# Patient Record
Sex: Male | Born: 1985 | Race: White | Hispanic: No | Marital: Single | Smoking: Never smoker
Health system: Southern US, Community
[De-identification: ages and names within clinical notes are randomized; demographics above are authoritative.]

## PROBLEM LIST (undated history)

## (undated) DIAGNOSIS — J302 Other seasonal allergic rhinitis: Secondary | ICD-10-CM

## (undated) DIAGNOSIS — J45990 Exercise induced bronchospasm: Secondary | ICD-10-CM

## (undated) DIAGNOSIS — G43909 Migraine, unspecified, not intractable, without status migrainosus: Secondary | ICD-10-CM

## (undated) DIAGNOSIS — I10 Essential (primary) hypertension: Secondary | ICD-10-CM

## (undated) HISTORY — PX: APPENDECTOMY: SHX54

## (undated) HISTORY — PX: TONSILLECTOMY: SUR1361

---

## 2018-07-17 ENCOUNTER — Encounter (HOSPITAL_COMMUNITY): Payer: Self-pay | Admitting: Emergency Medicine

## 2018-07-17 ENCOUNTER — Other Ambulatory Visit: Payer: Self-pay

## 2018-07-17 ENCOUNTER — Inpatient Hospital Stay (HOSPITAL_COMMUNITY)
Admission: EM | Admit: 2018-07-17 | Discharge: 2018-07-19 | DRG: 342 | Disposition: A | Discharge status: Home / Self Care | Payer: Self-pay | Attending: Surgery | Admitting: Surgery

## 2018-07-17 ENCOUNTER — Ambulatory Visit (HOSPITAL_COMMUNITY)
Admission: EM | Admit: 2018-07-17 | Discharge: 2018-07-17 | Disposition: A | Discharge status: Home / Self Care | Payer: Self-pay | Attending: Family Medicine | Admitting: Family Medicine

## 2018-07-17 DIAGNOSIS — R1011 Right upper quadrant pain: Secondary | ICD-10-CM

## 2018-07-17 DIAGNOSIS — Z6841 Body Mass Index (BMI) 40.0 and over, adult: Secondary | ICD-10-CM

## 2018-07-17 DIAGNOSIS — Z8249 Family history of ischemic heart disease and other diseases of the circulatory system: Secondary | ICD-10-CM

## 2018-07-17 DIAGNOSIS — K3580 Unspecified acute appendicitis: Principal | ICD-10-CM

## 2018-07-17 DIAGNOSIS — Z79899 Other long term (current) drug therapy: Secondary | ICD-10-CM

## 2018-07-17 DIAGNOSIS — E669 Obesity, unspecified: Secondary | ICD-10-CM | POA: Diagnosis present

## 2018-07-17 LAB — CBC
HEMATOCRIT: 44.7 % (ref 39.0–52.0)
Hemoglobin: 15.4 g/dL (ref 13.0–17.0)
MCH: 30.6 pg (ref 26.0–34.0)
MCHC: 34.5 g/dL (ref 30.0–36.0)
MCV: 88.9 fL (ref 80.0–100.0)
Platelets: 307 10*3/uL (ref 150–400)
RBC: 5.03 MIL/uL (ref 4.22–5.81)
RDW: 12.1 % (ref 11.5–15.5)
WBC: 15.5 10*3/uL — ABNORMAL HIGH (ref 4.0–10.5)
nRBC: 0 % (ref 0.0–0.2)

## 2018-07-17 LAB — COMPREHENSIVE METABOLIC PANEL
ALT: 40 U/L (ref 0–44)
AST: 20 U/L (ref 15–41)
Albumin: 4.4 g/dL (ref 3.5–5.0)
Alkaline Phosphatase: 57 U/L (ref 38–126)
Anion gap: 10 (ref 5–15)
BUN: 13 mg/dL (ref 6–20)
CHLORIDE: 104 mmol/L (ref 98–111)
CO2: 23 mmol/L (ref 22–32)
CREATININE: 0.99 mg/dL (ref 0.61–1.24)
Calcium: 9.3 mg/dL (ref 8.9–10.3)
GFR calc Af Amer: >60 mL/min (ref >60)
GLUCOSE: 95 mg/dL (ref 70–99)
Potassium: 3.9 mmol/L (ref 3.5–5.1)
SODIUM: 137 mmol/L (ref 135–145)
Total Bilirubin: 1.5 mg/dL — ABNORMAL HIGH (ref 0.3–1.2)
Total Protein: 7.1 g/dL (ref 6.5–8.1)

## 2018-07-17 LAB — URINALYSIS, ROUTINE W REFLEX MICROSCOPIC
BILIRUBIN URINE: NEGATIVE
GLUCOSE, UA: NEGATIVE mg/dL
Hgb urine dipstick: NEGATIVE
KETONES UR: NEGATIVE mg/dL
Leukocytes, UA: NEGATIVE
Nitrite: NEGATIVE
PH: 6 (ref 5.0–8.0)
Protein, ur: NEGATIVE mg/dL
Specific Gravity, Urine: 1.027 (ref 1.005–1.030)

## 2018-07-17 LAB — LIPASE, BLOOD: LIPASE: 35 U/L (ref 11–51)

## 2018-07-17 MED ORDER — ONDANSETRON HCL 4 MG/2ML IJ SOLN
INTRAMUSCULAR | Status: AC
  Filled 2018-07-17: qty 2

## 2018-07-17 MED ORDER — ONDANSETRON HCL 4 MG PO TABS: 4.0000 mg | ORAL_TABLET | Freq: Three times a day (TID) | ORAL | 0 refills | Status: DC | PRN

## 2018-07-17 MED ORDER — ONDANSETRON HCL 4 MG/2ML IJ SOLN
4.0000 mg | Freq: Once | INTRAMUSCULAR | Status: AC
  Administered 2018-07-17: 4 mg via INTRAMUSCULAR

## 2018-07-17 NOTE — ED Triage Notes (Signed)
Abdominal pain, hot flashes, cold chills and nausea and bloated.  Symptoms started this morning

## 2018-07-17 NOTE — ED Triage Notes (Addendum)
Patient reports RUQ pain with emesis onset this morning , denies fever or diarrhea , seen at [facility] urgent care this evening with the same complaints-discharged home .

## 2018-07-17 NOTE — ED Provider Notes (Signed)
[facility]    CSN: [id number] Arrival date & time: 07/17/18  1929     History   Chief Complaint Chief Complaint  Patient presents with  . Abdominal Pain    HPI Jacob Carr is a 32 y.o. male.   Patient awoke this morning with right upper quadrant pain nausea and bloated feeling.  Did not eat all day but ate a grilled cheese prior to arrival.  No vomiting but still feels bloated and nauseated.  First occurrence of the symptoms. Also noted his blood pressure is elevated.  Historically blood pressures have been normal I suspect blood pressures are related to how he feels otherwise  HPI  History reviewed. No pertinent past medical history.  There are no active problems to display for this patient.   History reviewed. No pertinent surgical history.     Home Medications    Prior to Admission medications   Medication Sig Start Date End Date Taking? Authorizing Provider  bismuth subsalicylate (PEPTO BISMOL) 262 MG/15ML suspension Take 30 mLs by mouth every 6 (six) hours as needed.   Yes [provider]    Family History Family History  Problem Relation Age of Onset  . Hypertension Mother   . Bradycardia Father     Social History Social History   Tobacco Use  . Smoking status: Never Smoker  Substance Use Topics  . Alcohol use: Not Currently  . Drug use: Never     Allergies   Patient has no known allergies.   Review of Systems Review of Systems  Constitutional: Negative.   HENT: Negative.   Respiratory: Negative.   Cardiovascular: Negative.   Gastrointestinal: Positive for abdominal distention, abdominal pain and nausea.  Neurological: Negative.   Psychiatric/Behavioral: Negative.      Physical Exam Triage Vital Signs ED Triage Vitals  Enc Vitals Group     BP 07/17/18 2011 (!) 175/112     Pulse Rate 07/17/18 2011 (!) 102     Resp 07/17/18 2011 20     Temp 07/17/18 2011 98.9 F (37.2 C)     Temp Source 07/17/18 2011 Oral   SpO2 07/17/18 2011 96 %     Weight --      Height --      Head Circumference --      Peak Flow --      Pain Score 07/17/18 2009 7     Pain Loc --      Pain Edu? --      Excl. in GC? --    No data found.  Updated Vital Signs BP (!) 175/112 (BP Location: Left Arm)   Pulse (!) 102   Temp 98.9 F (37.2 C) (Oral)   Resp 20   SpO2 96%   Visual Acuity Right Eye Distance:   Left Eye Distance:   Bilateral Distance:    Right Eye Near:   Left Eye Near:    Bilateral Near:     Physical Exam  Constitutional: He appears well-developed and well-nourished. He appears distressed.  HENT:  Mouth/Throat: Oropharynx is clear and moist.  Cardiovascular: Normal rate and regular rhythm.  Abdominal: Bowel sounds are normal. There is generalized tenderness and tenderness in the right upper quadrant. There is no rebound, no guarding and negative Murphy's sign.     UC Treatments / Results  Labs (all labs ordered are listed, but only abnormal results are displayed) Labs Reviewed - No data to display  EKG None  Radiology No results found.  Procedures Procedures (including critical care time)  Medications Ordered in UC Medications - No data to display  Initial Impression / Assessment and Plan / UC Course  I have reviewed the triage vital signs and the nursing notes.  Pertinent labs & imaging results that were available during my care of the patient were reviewed by me and considered in my medical decision making (see chart for details).     Abdominal pain.  Symptoms would really fit with gallbladder disease.  I would like to order ultrasound which has been done.  Also Zofran for nausea. Needs to follow-up with blood pressure. Final Clinical Impressions(s) / UC Diagnoses   Final diagnoses:  None   Discharge Instructions   None    ED Prescriptions    None     Controlled Substance Prescriptions [place] Controlled Substance Registry consulted? No   Frederica Kuster,  MD 07/17/18 2043

## 2018-07-18 ENCOUNTER — Inpatient Hospital Stay (HOSPITAL_COMMUNITY): Payer: Self-pay | Admitting: Certified Registered"

## 2018-07-18 ENCOUNTER — Encounter (HOSPITAL_COMMUNITY): Payer: Self-pay

## 2018-07-18 ENCOUNTER — Emergency Department (HOSPITAL_COMMUNITY): Payer: Self-pay

## 2018-07-18 ENCOUNTER — Other Ambulatory Visit: Payer: Self-pay

## 2018-07-18 ENCOUNTER — Encounter (HOSPITAL_COMMUNITY): Admission: EM | Disposition: A | Discharge status: Home / Self Care | Payer: Self-pay | Source: Home / Self Care

## 2018-07-18 DIAGNOSIS — K3580 Unspecified acute appendicitis: Secondary | ICD-10-CM | POA: Diagnosis present

## 2018-07-18 HISTORY — PX: LAPAROSCOPIC APPENDECTOMY: SHX408

## 2018-07-18 SURGERY — APPENDECTOMY, LAPAROSCOPIC
Anesthesia: General | Site: Abdomen

## 2018-07-18 MED ORDER — MIDAZOLAM HCL 2 MG/2ML IJ SOLN
INTRAMUSCULAR | Status: AC
  Filled 2018-07-18: qty 2

## 2018-07-18 MED ORDER — PROMETHAZINE HCL 25 MG/ML IJ SOLN: 6.2500 mg | INTRAMUSCULAR | Status: DC | PRN

## 2018-07-18 MED ORDER — ENOXAPARIN SODIUM 40 MG/0.4ML ~~LOC~~ SOLN
40.0000 mg | SUBCUTANEOUS | Status: DC
  Administered 2018-07-19: 40 mg via SUBCUTANEOUS
  Filled 2018-07-18: qty 0.4

## 2018-07-18 MED ORDER — LACTATED RINGERS IV SOLN
INTRAVENOUS | Status: DC
  Administered 2018-07-18: 09:00:00 via INTRAVENOUS

## 2018-07-18 MED ORDER — PROPOFOL 10 MG/ML IV BOLUS
INTRAVENOUS | Status: AC
  Filled 2018-07-18: qty 40

## 2018-07-18 MED ORDER — FENTANYL CITRATE (PF) 250 MCG/5ML IJ SOLN
INTRAMUSCULAR | Status: DC | PRN
  Administered 2018-07-18: 50 ug via INTRAVENOUS
  Administered 2018-07-18: 100 ug via INTRAVENOUS
  Administered 2018-07-18: 50 ug via INTRAVENOUS
  Administered 2018-07-18: 100 ug via INTRAVENOUS
  Administered 2018-07-18: 50 ug via INTRAVENOUS

## 2018-07-18 MED ORDER — ONDANSETRON HCL 4 MG/2ML IJ SOLN: 4.0000 mg | Freq: Three times a day (TID) | INTRAMUSCULAR | Status: AC | PRN

## 2018-07-18 MED ORDER — SODIUM CHLORIDE 0.9 % IV SOLN
2.0000 g | Freq: Once | INTRAVENOUS | Status: AC
  Administered 2018-07-18: 2 g via INTRAVENOUS
  Filled 2018-07-18: qty 20

## 2018-07-18 MED ORDER — BUPIVACAINE-EPINEPHRINE 0.5% -1:200000 IJ SOLN
INTRAMUSCULAR | Status: AC
  Filled 2018-07-18: qty 1

## 2018-07-18 MED ORDER — FAMOTIDINE IN NACL 20-0.9 MG/50ML-% IV SOLN
20.0000 mg | INTRAVENOUS | Status: AC
  Administered 2018-07-18: 20 mg via INTRAVENOUS
  Filled 2018-07-18: qty 50

## 2018-07-18 MED ORDER — HYDROMORPHONE HCL 1 MG/ML IJ SOLN: 1.0000 mg | INTRAMUSCULAR | Status: AC | PRN

## 2018-07-18 MED ORDER — METOCLOPRAMIDE HCL 5 MG/ML IJ SOLN
10.0000 mg | INTRAMUSCULAR | Status: AC
  Administered 2018-07-18: 10 mg via INTRAVENOUS
  Filled 2018-07-18: qty 2

## 2018-07-18 MED ORDER — OXYCODONE HCL 5 MG/5ML PO SOLN: 5.0000 mg | Freq: Once | ORAL | Status: DC | PRN

## 2018-07-18 MED ORDER — PROPOFOL 10 MG/ML IV BOLUS
INTRAVENOUS | Status: DC | PRN
  Administered 2018-07-18: 200 mg via INTRAVENOUS

## 2018-07-18 MED ORDER — 0.9 % SODIUM CHLORIDE (POUR BTL) OPTIME
TOPICAL | Status: DC | PRN
  Administered 2018-07-18: 1000 mL

## 2018-07-18 MED ORDER — SODIUM CHLORIDE 0.9 % IR SOLN
Status: DC | PRN
  Administered 2018-07-18: 1000 mL

## 2018-07-18 MED ORDER — MIDAZOLAM HCL 5 MG/5ML IJ SOLN
INTRAMUSCULAR | Status: DC | PRN
  Administered 2018-07-18: 2 mg via INTRAVENOUS

## 2018-07-18 MED ORDER — ROCURONIUM BROMIDE 10 MG/ML (PF) SYRINGE
PREFILLED_SYRINGE | INTRAVENOUS | Status: DC | PRN
  Administered 2018-07-18: 20 mg via INTRAVENOUS
  Administered 2018-07-18: 30 mg via INTRAVENOUS

## 2018-07-18 MED ORDER — IOHEXOL 300 MG/ML  SOLN
100.0000 mL | Freq: Once | INTRAMUSCULAR | Status: AC | PRN
  Administered 2018-07-18: 100 mL via INTRAVENOUS

## 2018-07-18 MED ORDER — DICYCLOMINE HCL 10 MG/ML IM SOLN: 20.0000 mg | Freq: Once | INTRAMUSCULAR | Status: DC

## 2018-07-18 MED ORDER — OXYCODONE HCL 5 MG PO TABS: 5.0000 mg | ORAL_TABLET | ORAL | Status: DC | PRN

## 2018-07-18 MED ORDER — BUPIVACAINE-EPINEPHRINE 0.5% -1:200000 IJ SOLN
INTRAMUSCULAR | Status: DC | PRN
  Administered 2018-07-18: 20 mL

## 2018-07-18 MED ORDER — HYDROMORPHONE HCL 1 MG/ML IJ SOLN
INTRAMUSCULAR | Status: AC
  Filled 2018-07-18: qty 1

## 2018-07-18 MED ORDER — METRONIDAZOLE IN NACL 5-0.79 MG/ML-% IV SOLN
500.0000 mg | Freq: Once | INTRAVENOUS | Status: AC
  Administered 2018-07-18: 500 mg via INTRAVENOUS
  Filled 2018-07-18: qty 100

## 2018-07-18 MED ORDER — ONDANSETRON HCL 4 MG/2ML IJ SOLN
INTRAMUSCULAR | Status: DC | PRN
  Administered 2018-07-18: 4 mg via INTRAVENOUS

## 2018-07-18 MED ORDER — SUGAMMADEX SODIUM 200 MG/2ML IV SOLN
INTRAVENOUS | Status: DC | PRN
  Administered 2018-07-18: 400 mg via INTRAVENOUS

## 2018-07-18 MED ORDER — DEXAMETHASONE SODIUM PHOSPHATE 10 MG/ML IJ SOLN
INTRAMUSCULAR | Status: DC | PRN
  Administered 2018-07-18: 10 mg via INTRAVENOUS

## 2018-07-18 MED ORDER — HYDROMORPHONE HCL 1 MG/ML IJ SOLN
1.0000 mg | INTRAMUSCULAR | Status: DC | PRN
  Administered 2018-07-18: 1 mg via INTRAVENOUS
  Filled 2018-07-18: qty 1

## 2018-07-18 MED ORDER — SUCCINYLCHOLINE 20MG/ML (10ML) SYRINGE FOR MEDFUSION PUMP - OPTIME
INTRAMUSCULAR | Status: DC | PRN
  Administered 2018-07-18: 120 mg via INTRAVENOUS

## 2018-07-18 MED ORDER — LIDOCAINE 2% (20 MG/ML) 5 ML SYRINGE
INTRAMUSCULAR | Status: DC | PRN
  Administered 2018-07-18: 100 mg via INTRAVENOUS

## 2018-07-18 MED ORDER — OXYCODONE HCL 5 MG PO TABS: 5.0000 mg | ORAL_TABLET | Freq: Once | ORAL | Status: DC | PRN

## 2018-07-18 MED ORDER — HYDROMORPHONE HCL 1 MG/ML IJ SOLN
0.2500 mg | INTRAMUSCULAR | Status: DC | PRN
  Administered 2018-07-18: 0.5 mg via INTRAVENOUS

## 2018-07-18 MED ORDER — MORPHINE SULFATE (PF) 4 MG/ML IV SOLN
4.0000 mg | Freq: Once | INTRAVENOUS | Status: AC
  Administered 2018-07-18: 4 mg via INTRAVENOUS
  Filled 2018-07-18: qty 1

## 2018-07-18 MED ORDER — SODIUM CHLORIDE 0.9 % IV SOLN
INTRAVENOUS | Status: AC
  Administered 2018-07-18 (×2): via INTRAVENOUS

## 2018-07-18 MED ORDER — FENTANYL CITRATE (PF) 250 MCG/5ML IJ SOLN
INTRAMUSCULAR | Status: AC
  Filled 2018-07-18: qty 5

## 2018-07-18 MED ORDER — MEPERIDINE HCL 50 MG/ML IJ SOLN: 6.2500 mg | INTRAMUSCULAR | Status: DC | PRN

## 2018-07-18 SURGICAL SUPPLY — 37 items
APPLIER CLIP 5 13 M/L LIGAMAX5 (MISCELLANEOUS)
APPLIER CLIP ROT 10 11.4 M/L (STAPLE)
CANISTER SUCT 3000ML PPV (MISCELLANEOUS) ×3 IMPLANT
CHLORAPREP W/TINT 26ML (MISCELLANEOUS) ×3 IMPLANT
CLIP APPLIE 5 13 M/L LIGAMAX5 (MISCELLANEOUS) IMPLANT
CLIP APPLIE ROT 10 11.4 M/L (STAPLE) IMPLANT
COVER SURGICAL LIGHT HANDLE (MISCELLANEOUS) ×3 IMPLANT
COVER WAND RF STERILE (DRAPES) IMPLANT
CUTTER FLEX LINEAR 45M (STAPLE) ×3 IMPLANT
DERMABOND ADVANCED (GAUZE/BANDAGES/DRESSINGS) ×2
DERMABOND ADVANCED .7 DNX12 (GAUZE/BANDAGES/DRESSINGS) ×1 IMPLANT
ELECT REM PT RETURN 9FT ADLT (ELECTROSURGICAL) ×3
ELECTRODE REM PT RTRN 9FT ADLT (ELECTROSURGICAL) ×1 IMPLANT
GLOVE SURG SIGNA 7.5 PF LTX (GLOVE) ×3 IMPLANT
GOWN STRL REUS W/ TWL LRG LVL3 (GOWN DISPOSABLE) ×2 IMPLANT
GOWN STRL REUS W/ TWL XL LVL3 (GOWN DISPOSABLE) ×1 IMPLANT
GOWN STRL REUS W/TWL LRG LVL3 (GOWN DISPOSABLE) ×4
GOWN STRL REUS W/TWL XL LVL3 (GOWN DISPOSABLE) ×2
KIT BASIN OR (CUSTOM PROCEDURE TRAY) ×3 IMPLANT
KIT TURNOVER KIT B (KITS) ×3 IMPLANT
NS IRRIG 1000ML POUR BTL (IV SOLUTION) ×3 IMPLANT
PAD ARMBOARD 7.5X6 YLW CONV (MISCELLANEOUS) ×6 IMPLANT
POUCH SPECIMEN RETRIEVAL 10MM (ENDOMECHANICALS) ×3 IMPLANT
RELOAD 45 VASCULAR/THIN (ENDOMECHANICALS) IMPLANT
RELOAD STAPLE TA45 3.5 REG BLU (ENDOMECHANICALS) ×3 IMPLANT
SET IRRIG TUBING LAPAROSCOPIC (IRRIGATION / IRRIGATOR) ×3 IMPLANT
SHEARS HARMONIC ACE PLUS 36CM (ENDOMECHANICALS) ×3 IMPLANT
SLEEVE ENDOPATH XCEL 5M (ENDOMECHANICALS) ×3 IMPLANT
SPECIMEN JAR SMALL (MISCELLANEOUS) ×3 IMPLANT
SUT MON AB 4-0 PC3 18 (SUTURE) ×3 IMPLANT
TOWEL OR 17X24 6PK STRL BLUE (TOWEL DISPOSABLE) ×3 IMPLANT
TOWEL OR 17X26 10 PK STRL BLUE (TOWEL DISPOSABLE) ×3 IMPLANT
TRAY LAPAROSCOPIC MC (CUSTOM PROCEDURE TRAY) ×3 IMPLANT
TROCAR XCEL BLUNT TIP 100MML (ENDOMECHANICALS) ×3 IMPLANT
TROCAR XCEL NON-BLD 5MMX100MML (ENDOMECHANICALS) ×3 IMPLANT
TUBING INSUFFLATION (TUBING) ×3 IMPLANT
WATER STERILE IRR 1000ML POUR (IV SOLUTION) ×3 IMPLANT

## 2018-07-18 NOTE — ED Notes (Signed)
Patient transported to Ultrasound 

## 2018-07-18 NOTE — Progress Notes (Signed)
Nurse educated  Patient on the the importance of using th incentive spirometry.  Patient verbalized understanding

## 2018-07-18 NOTE — H&P (Signed)
Jacob Carr is an 32 y.o. male.   Chief Complaint: abdominal pain HPI:  32 yo male presenting to [facility] for surgical consult for appendicitis. Patient began experiencing abdominal pain that woke him from his sleep 2 nights ago. Patient says that the pain started on the right side and migrated to the RLQ. Patient claims that the pain subsided long enough for him to go back to sleep and go to work yesterday. Throughout the day at work, the pain became progressively worse. Patient tried taking pepto bismol which did not alleviate the pain. Patient also reports hot flashes and chills. Patient went to urgent care around 1940 yesterday and was told to go to the ED if symptoms did not improve with Zofran. Patient continued to have 8/10 abdominal pain and vomited, prompting him to go to the ED. Patient does not have a history of abdominal surgery.    History reviewed. No pertinent past medical history.  History reviewed. No pertinent surgical history.  Family History  Problem Relation Age of Onset  . Hypertension Mother   . Bradycardia Father    Social History:  reports that he has never smoked. He has never used smokeless tobacco. He reports that he drank alcohol. He reports that he does not use drugs.  Allergies: No Known Allergies  Medications Prior to Admission  Medication Sig Dispense Refill  . bismuth subsalicylate (PEPTO BISMOL) 262 MG/15ML suspension Take 30 mLs by mouth every 6 (six) hours as needed.    . ondansetron (ZOFRAN) 4 MG tablet Take 1 tablet (4 mg total) by mouth every 8 (eight) hours as needed for nausea or vomiting. 4 tablet 0    Results for orders placed or performed during the hospital encounter of 07/17/18 (from the past 48 hour(s))  Urinalysis, Routine w reflex microscopic     Status: None   Collection Time: 07/17/18 10:16 PM  Result Value Ref Range   Color, Urine YELLOW YELLOW   APPearance CLEAR CLEAR   Specific Gravity, Urine 1.027 1.005 - 1.030   pH 6.0  5.0 - 8.0   Glucose, UA NEGATIVE NEGATIVE mg/dL   Hgb urine dipstick NEGATIVE NEGATIVE   Bilirubin Urine NEGATIVE NEGATIVE   Ketones, ur NEGATIVE NEGATIVE mg/dL   Protein, ur NEGATIVE NEGATIVE mg/dL   Nitrite NEGATIVE NEGATIVE   Leukocytes, UA NEGATIVE NEGATIVE    Comment: Performed at [facility] [address] [place], [place] [address]  Lipase, blood     Status: None   Collection Time: 07/17/18 10:26 PM  Result Value Ref Range   Lipase 35 11 - 51 U/L    Comment: Performed at [facility] [address], [place], [place] [address]  Comprehensive metabolic panel     Status: Abnormal   Collection Time: 07/17/18 10:26 PM  Result Value Ref Range   Sodium 137 135 - 145 mmol/L   Potassium 3.9 3.5 - 5.1 mmol/L   Chloride 104 98 - 111 mmol/L   CO2 23 22 - 32 mmol/L   Glucose, Bld 95 70 - 99 mg/dL   BUN 13 6 - 20 mg/dL   Creatinine, Ser 0.99 0.61 - 1.24 mg/dL   Calcium 9.3 8.9 - 10.3 mg/dL   Total Protein 7.1 6.5 - 8.1 g/dL   Albumin 4.4 3.5 - 5.0 g/dL   AST 20 15 - 41 U/L   ALT 40 0 - 44 U/L   Alkaline Phosphatase 57 38 - 126 U/L   Total Bilirubin 1.5 (  H) 0.3 - 1.2 mg/dL   GFR calc non Af Amer >60 >60 mL/min   GFR calc Af Amer >60 >60 mL/min    Comment: (NOTE) The eGFR has been calculated using the CKD EPI equation. This calculation has not been validated in all clinical situations. eGFR's persistently <60 mL/min signify possible Chronic Kidney Disease.    Anion gap 10 5 - 15    Comment: Performed at [facility] [address], [place], [place] [address]  CBC     Status: Abnormal   Collection Time: 07/17/18 10:26 PM  Result Value Ref Range   WBC 15.5 (H) 4.0 - 10.5 K/uL   RBC 5.03 4.22 - 5.81 MIL/uL   Hemoglobin 15.4 13.0 - 17.0 g/dL   HCT 44.7 39.0 - 52.0 %   MCV 88.9 80.0 - 100.0 fL   MCH 30.6 26.0 - 34.0 pg   MCHC 34.5 30.0 - 36.0 g/dL   RDW 12.1 11.5 - 15.5 %   Platelets 307 150 - 400 K/uL   nRBC 0.0 0.0 - 0.2 %    Comment: Performed at  [facility], [place] [address], [place], [place] [address]   Ct Abdomen Pelvis W Contrast  Result Date: 07/18/2018 CLINICAL DATA:  Right flank pain, vomiting EXAM: CT ABDOMEN AND PELVIS WITH CONTRAST TECHNIQUE: Multidetector CT imaging of the abdomen and pelvis was performed using the standard protocol following bolus administration of intravenous contrast. CONTRAST:  153m OMNIPAQUE IOHEXOL 300 MG/ML  SOLN COMPARISON:  Ultrasound 07/18/2018 FINDINGS: Lower chest: Lung bases are clear. No effusions. Heart is normal size. Hepatobiliary: Diffuse low-density throughout the liver compatible with fatty infiltration. No focal abnormality. Gallbladder unremarkable. Pancreas: No focal abnormality or ductal dilatation. Spleen: No focal abnormality.  Normal size. Adrenals/Urinary Tract: No adrenal abnormality. No focal renal abnormality. No stones or hydronephrosis. Urinary bladder is unremarkable. Stomach/Bowel: Stomach, large and small bowel grossly unremarkable. Distended appendix measures up to 18 mm in diameter with surrounding inflammatory change compatible with acute appendicitis. Vascular/Lymphatic: No evidence of aneurysm or adenopathy. Reproductive: No visible focal abnormality. Other: No free fluid or free air. Musculoskeletal: No acute bony abnormality. IMPRESSION: Dilated appendix with periappendiceal inflammation compatible with acute appendicitis. Fatty infiltration of the liver. Electronically Signed   By: KRolm BaptiseM.D.   On: 07/18/2018 02:16   UKoreaAbdomen Limited  Result Date: 07/18/2018 CLINICAL DATA:  Right upper quadrant pain EXAM: ULTRASOUND ABDOMEN LIMITED RIGHT UPPER QUADRANT COMPARISON:  None. FINDINGS: Gallbladder: No gallstones or wall thickening visualized. No sonographic Murphy sign noted by sonographer. Common bile duct: Diameter: Normal caliber, 4 mm Liver: Increased echotexture compatible with fatty infiltration. No focal abnormality or biliary ductal dilatation. Portal vein is  patent on color Doppler imaging with normal direction of blood flow towards the liver. IMPRESSION: Fatty infiltration of the liver. Electronically Signed   By: KRolm BaptiseM.D.   On: 07/18/2018 00:53    Review of Systems  Constitutional: Positive for chills.  Gastrointestinal: Positive for abdominal pain, nausea and vomiting. Negative for blood in stool, constipation, diarrhea and melena.  Genitourinary: Negative for dysuria.    Blood pressure 133/64, pulse 95, temperature 98.9 F (37.2 C), temperature source Oral, resp. rate 20, height '6\' 5"'  (1.956 m), weight (!) 167.4 kg, SpO2 94 %. Physical Exam  Constitutional: He is oriented to person, place, and time. He appears well-developed and well-nourished. No distress.  HENT:  Head: Normocephalic and atraumatic.  Eyes: Conjunctivae and EOM are normal.  Neck:  Normal range of motion. Neck supple.  Respiratory: Effort normal. No respiratory distress.  GI: He exhibits no distension and no mass. There is tenderness in the right lower quadrant. There is guarding and tenderness at McBurney's point. There is no rebound and negative Murphy's sign.  Neurological: He is alert and oriented to person, place, and time.  Skin: Skin is warm and dry.     Assessment/Plan Mr. Baswell is a 32 year old male presenting with abdominal pain Appendicitis was confirmed with abdominal CT. Appendectomy scheduled. Abdominal pain- currently being well controlled with Dilaudid. Nausea and vomiting- well controlled with Zofran    Glyn Gerads, Student-PA 07/18/2018, 6:45 AM

## 2018-07-18 NOTE — Op Note (Signed)
Appendectomy, Lap, Procedure Note  Indications: The patient presented with a history of right-sided abdominal pain. A CT revealed findings consistent with acute appendicitis.  Pre-operative Diagnosis: acute appendicitis  Post-operative Diagnosis: Same  Surgeon: Abigail Miyamoto A   Assistants: 0  Anesthesia: General endotracheal anesthesia  ASA Class: 2  Procedure Details  The patient was seen again in the Holding Room. The risks, benefits, complications, treatment options, and expected outcomes were discussed with the patient and/or family. The possibilities of reaction to medication, perforation of viscus, bleeding, recurrent infection, finding a normal appendix, the need for additional procedures, failure to diagnose a condition, and creating a complication requiring transfusion or operation were discussed. There was concurrence with the proposed plan and informed consent was obtained. The site of surgery was properly noted. The patient was taken to Operating Room, identified as Jacob Carr and the procedure verified as Appendectomy. A Time Out was held and the above information confirmed.  The patient was placed in the supine position and general anesthesia was induced, along with placement of orogastric tube, Venodyne boots, and a Foley catheter. The abdomen was prepped and draped in a sterile fashion. A one centimeter infraumbilical incision was made.  The midline fascia was incised with a #15 blade.  A Kelly clamp was used to confirm entrance into the peritoneal cavity.  A pursestring suture was passed around the incision with a 0 Vicryl.  The Hasson was introduced into the abdomen and the tails of the suture were used to hold the Hasson in place.   The pneumoperitoneum was then established to steady pressure of 15 mmHg.  Additional 5 mm cannulas then placed in the left lower quadrant of the abdomen and the right upper quadrant under direct visualization. A careful evaluation of the  entire abdomen was carried out. The patient was placed in Trendelenburg and left lateral decubitus position. The small intestines were retracted in the cephalad and left lateral direction away from the pelvis and right lower quadrant. The patient was found to have an enlarged and inflamed appendix that was extending into the pelvis. There was no evidence of perforation.  The appendix was carefully dissected. The appendix was was skeletonized with the harmonic scalpel.   The appendix was divided at its base using an endo-GIA stapler. Minimal appendiceal stump was left in place. There was no evidence of bleeding, leakage, or complication after division of the appendix. Irrigation was also performed and irrigate suctioned from the abdomen as well.  The umbilical port site was closed with the purse string suture. There was no residual palpable fascial defect.  The trocar site skin wounds were closed with 4-0 Monocryl. Skin glue was then applied.  Instrument, sponge, and needle counts were correct at the conclusion of the case.   Findings: The appendix was found to be inflamed. There were not signs of necrosis.  There was not perforation. There was not abscess formation.  Estimated Blood Loss:  less than 50 mL         Drains:none         Complications:  None; patient tolerated the procedure well.         Disposition: PACU - hemodynamically stable.         Condition: stable

## 2018-07-18 NOTE — ED Provider Notes (Signed)
[facility] EMERGENCY DEPARTMENT Provider Note   CSN: [id number] Arrival date & time: 07/17/18  2158    History   Chief Complaint Chief Complaint  Patient presents with  . Abdominal Pain    HPI Jacob Carr is a 32 y.o. male.   32 year old male presents to the emergency department for evaluation of abdominal pain.  He states that he began experiencing discomfort this morning.  It has progressed throughout the day.  He had worsening symptoms around 1500.  Was seen at urgent care around 1930 tonight for persistent pain and developing nausea.  He was given a dose of Zofran which helped his nausea, though he did have one episode of vomiting after leaving the urgent care.  He was advised to be seen in the emergency department if his symptoms continued.  Given worsening pain tonight, as well as migration to the right lower quadrant, he return for repeat evaluation.  Has previously tried to manage pain with Pepto-Bismol.  Denies any sick contacts, fever, urinary symptoms, diarrhea, melena, hematochezia.  No history of abdominal surgeries.  The history is provided by the patient. No language interpreter was used.    History reviewed. No pertinent past medical history.  There are no active problems to display for this patient.   History reviewed. No pertinent surgical history.      Home Medications    Prior to Admission medications   Medication Sig Start Date End Date Taking? Authorizing Provider  bismuth subsalicylate (PEPTO BISMOL) 262 MG/15ML suspension Take 30 mLs by mouth every 6 (six) hours as needed.    [provider]  ondansetron (ZOFRAN) 4 MG tablet Take 1 tablet (4 mg total) by mouth every 8 (eight) hours as needed for nausea or vomiting. 07/17/18   Frederica Kuster, MD    Family History Family History  Problem Relation Age of Onset  . Hypertension Mother   . Bradycardia Father     Social History Social History   Tobacco Use  .  Smoking status: Never Smoker  . Smokeless tobacco: Never Used  Substance Use Topics  . Alcohol use: Not Currently  . Drug use: Never     Allergies   Patient has no known allergies.   Review of Systems Review of Systems Ten systems reviewed and are negative for acute change, except as noted in the HPI.    Physical Exam Updated Vital Signs BP (!) 155/78 (BP Location: Right Arm)   Pulse 94   Temp 99.5 F (37.5 C) (Oral)   Resp 18   Ht 6\' 5"  (1.956 m)   Wt (!) 167.8 kg   SpO2 98%   BMI 43.88 kg/m   Physical Exam  Constitutional: He is oriented to person, place, and time. He appears well-developed and well-nourished. No distress.  Nontoxic appearing and in NAD  HENT:  Head: Normocephalic and atraumatic.  Eyes: Conjunctivae and EOM are normal. No scleral icterus.  Neck: Normal range of motion.  Cardiovascular: Normal rate, regular rhythm and intact distal pulses.  Pulmonary/Chest: Effort normal. No stridor. No respiratory distress.  Respirations even and unlabored  Abdominal:  TTP in the right mid abdomen and RLQ. Guarding in the RLQ. No peritoneal signs. Abdomen is obese.  Musculoskeletal: Normal range of motion.  Neurological: He is alert and oriented to person, place, and time. He exhibits normal muscle tone. Coordination normal.  Skin: Skin is warm and dry. No rash noted. He is not diaphoretic. No erythema. No pallor.  Psychiatric: He  has a normal mood and affect. His behavior is normal.  Nursing note and vitals reviewed.    ED Treatments / Results  Labs (all labs ordered are listed, but only abnormal results are displayed) Labs Reviewed  COMPREHENSIVE METABOLIC PANEL - Abnormal; Notable for the following components:      Result Value   Total Bilirubin 1.5 (*)    All other components within normal limits  CBC - Abnormal; Notable for the following components:   WBC 15.5 (*)    All other components within normal limits  LIPASE, BLOOD  URINALYSIS, ROUTINE W  REFLEX MICROSCOPIC    EKG None  Radiology Ct Abdomen Pelvis W Contrast  Result Date: 07/18/2018 CLINICAL DATA:  Right flank pain, vomiting EXAM: CT ABDOMEN AND PELVIS WITH CONTRAST TECHNIQUE: Multidetector CT imaging of the abdomen and pelvis was performed using the standard protocol following bolus administration of intravenous contrast. CONTRAST:  OMNIPAQUE IOHEXOL 300 MG/ML  SOLN COMPARISON:  Ultrasound 07/18/2018 FINDINGS: Lower chest: Lung bases are clear. No effusions. Heart is normal size. Hepatobiliary: Diffuse low-density throughout the liver compatible with fatty infiltration. No focal abnormality. Gallbladder unremarkable. Pancreas: No focal abnormality or ductal dilatation. Spleen: No focal abnormality.  Normal size. Adrenals/Urinary Tract: No adrenal abnormality. No focal renal abnormality. No stones or hydronephrosis. Urinary bladder is unremarkable. Stomach/Bowel: Stomach, large and small bowel grossly unremarkable. Distended appendix measures up to 18 mm in diameter with surrounding inflammatory change compatible with acute appendicitis. Vascular/Lymphatic: No evidence of aneurysm or adenopathy. Reproductive: No visible focal abnormality. Other: No free fluid or free air. Musculoskeletal: No acute bony abnormality. IMPRESSION: Dilated appendix with periappendiceal inflammation compatible with acute appendicitis. Fatty infiltration of the liver. Electronically Signed   By: Charlett Nose M.D.   On: 07/18/2018 02:16   US Abdomen Limited  Result Date: 07/18/2018 CLINICAL DATA:  Right upper quadrant pain EXAM: ULTRASOUND ABDOMEN LIMITED RIGHT UPPER QUADRANT COMPARISON:  None. FINDINGS: Gallbladder: No gallstones or wall thickening visualized. No sonographic Murphy sign noted by sonographer. Common bile duct: Diameter: Normal caliber, 4 mm Liver: Increased echotexture compatible with fatty infiltration. No focal abnormality or biliary ductal dilatation. Portal vein is patent on color  Doppler imaging with normal direction of blood flow towards the liver. IMPRESSION: Fatty infiltration of the liver. Electronically Signed   By: Charlett Nose M.D.   On: 07/18/2018 00:53    Procedures Procedures (including critical care time)  Medications Ordered in ED Medications  cefTRIAXone (ROCEPHIN) 2 g in sodium chloride 0.9 % 100 mL IVPB (has no administration in time range)    And  metroNIDAZOLE (FLAGYL) IVPB 500 mg (has no administration in time range)  metoCLOPramide (REGLAN) injection 10 mg (10 mg Intravenous Given 07/18/18 0026)  morphine 4 MG/ML injection 4 mg (4 mg Intravenous Given 07/18/18 0119)  famotidine (PEPCID) IVPB 20 mg premix (20 mg Intravenous New Bag/Given 07/18/18 0118)  iohexol (OMNIPAQUE) 300 MG/ML solution 100 mL (100 mLs Intravenous Contrast Given 07/18/18 0153)    1:30 AM Right upper quadrant ultrasound ordered prior to patient evaluation given leukocytosis as well as triage note complaining of right upper quadrant pain.  He had been seen a few hours ago at urgent care where MD documented RUQ tenderness with concern for gallbladder etiology.  His [place] today is negative.  On my exam, focal TTP is more so in the RLQ. Will obtain CT to r/o appendicitis. Symptoms have improved following Morphine, Reglan.  2:23 AM CT c/w acute appendicitis.  2:39 AM Case  discussed with Dr. Dwain Sarna who will admit. Temporary orders placed.   Initial Impression / Assessment and Plan / ED Course  I have reviewed the triage vital signs and the nursing notes.  Pertinent labs & imaging results that were available during my care of the patient were reviewed by me and considered in my medical decision making (see chart for details).      Patient to be admitted by the surgical service for acute appendicitis.  Currently resting comfortably.  He will remain NPO.   Final Clinical Impressions(s) / ED Diagnoses   Final diagnoses:  Acute appendicitis, unspecified acute appendicitis  type    ED Discharge Orders    None       Antony Madura, PA-C 07/18/18 0241    Gilda Crease, MD 07/18/18 [phone]

## 2018-07-18 NOTE — Anesthesia Procedure Notes (Signed)
Procedure Name: Intubation Date/Time: 07/18/2018 9:39 AM Performed by: Myna Bright, CRNA Pre-anesthesia Checklist: Patient identified, Emergency Drugs available, Suction available and Patient being monitored Patient Re-evaluated:Patient Re-evaluated prior to induction Oxygen Delivery Method: Circle System Utilized Preoxygenation: Pre-oxygenation with 100% oxygen Induction Type: IV induction, Rapid sequence and Cricoid Pressure applied Laryngoscope Size: Mac and 4 Grade View: Grade I Tube type: Oral Tube size: 8.0 mm Number of attempts: 1 Airway Equipment and Method: Stylet Placement Confirmation: ETT inserted through vocal cords under direct vision,  positive ETCO2 and breath sounds checked- equal and bilateral Secured at: 23 cm Tube secured with: Tape Dental Injury: Teeth and Oropharynx as per pre-operative assessment

## 2018-07-18 NOTE — Progress Notes (Signed)
Patient arrived back to the unit alert and oriented X 4 On a pain scale 0-10 patient sates a 3. He just received pain medication prior to arriving to the floor. 3 abdominal incisions closed with glued. Clean dry and intact. He has low grade temp of 100.4 nurse will call MD All questions and concern addressed. Nurse will continue monitor

## 2018-07-18 NOTE — Progress Notes (Signed)
Patient ID: Ranger Petrich, male   DOB: May 08, 1986, 32 y.o.   MRN: [id number]   Pre Procedure note for inpatients:   Dawson Albers has been scheduled for Procedure(s): APPENDECTOMY LAPAROSCOPIC (N/A) today. The various methods of treatment have been discussed with the patient. After consideration of the risks, benefits and treatment options the patient has consented to the planned procedure.   The patient has been seen and labs reviewed. There are no changes in the patient's condition to prevent proceeding with the planned procedure today.  Recent labs:  Lab Results  Component Value Date   WBC 15.5 (H) 07/17/2018   HGB 15.4 07/17/2018   HCT 44.7 07/17/2018   PLT 307 07/17/2018   GLUCOSE 95 07/17/2018   ALT 40 07/17/2018   AST 20 07/17/2018   NA 137 07/17/2018   K 3.9 07/17/2018   CL 104 07/17/2018   CREATININE 0.99 07/17/2018   BUN 13 07/17/2018   CO2 23 07/17/2018    Calvin Chura A, MD 07/18/2018 9:20 AM

## 2018-07-18 NOTE — Anesthesia Preprocedure Evaluation (Addendum)
Anesthesia Evaluation  Patient identified by MRN, date of birth, ID band Patient awake    Reviewed: Allergy & Precautions, NPO status , Patient's Chart, lab work & pertinent test results  Airway Mallampati: II  TM Distance: >3 FB Neck ROM: Full    Dental no notable dental hx.    Pulmonary neg pulmonary ROS,    Pulmonary exam normal breath sounds clear to auscultation       Cardiovascular negative cardio ROS Normal cardiovascular exam Rhythm:Regular Rate:Normal     Neuro/Psych negative neurological ROS  negative psych ROS   GI/Hepatic negative GI ROS, Neg liver ROS,   Endo/Other  Morbid obesity  Renal/GU negative Renal ROS  negative genitourinary   Musculoskeletal negative musculoskeletal ROS (+)   Abdominal (+) + obese,   Peds negative pediatric ROS (+)  Hematology negative hematology ROS (+)   Anesthesia Other Findings Appendicits  Reproductive/Obstetrics negative OB ROS                            Anesthesia Physical Anesthesia Plan  ASA: III  Anesthesia Plan: General   Post-op Pain Management:    Induction: Intravenous, Rapid sequence and Cricoid pressure planned  PONV Risk Score and Plan: 2 and Ondansetron and Midazolam  Airway Management Planned: Oral ETT  Additional Equipment:   Intra-op Plan:   Post-operative Plan: Extubation in OR  Informed Consent: I have reviewed the patients History and Physical, chart, labs and discussed the procedure including the risks, benefits and alternatives for the proposed anesthesia with the patient or authorized representative who has indicated his/her understanding and acceptance.   Dental advisory given  Plan Discussed with: CRNA  Anesthesia Plan Comments:        Anesthesia Quick Evaluation

## 2018-07-18 NOTE — Anesthesia Postprocedure Evaluation (Signed)
Anesthesia Post Note  Patient: Jacob Carr  Procedure(s) Performed: APPENDECTOMY LAPAROSCOPIC (N/A Abdomen)     Patient location during evaluation: PACU Anesthesia Type: General Level of consciousness: awake and alert Pain management: pain level controlled Vital Signs Assessment: post-procedure vital signs reviewed and stable Respiratory status: spontaneous breathing, nonlabored ventilation and respiratory function stable Cardiovascular status: blood pressure returned to baseline and stable Postop Assessment: no apparent nausea or vomiting Anesthetic complications: no    Last Vitals:  Vitals:   07/18/18 1107 07/18/18 1130  BP: (!) 143/88 (!) 143/73  Pulse: (!) 108 (!) 106  Resp: (!) 22 (!) 22  Temp:  (!) 38 C  SpO2: 95% 90%    Last Pain:  Vitals:   07/18/18 1130  TempSrc: Oral  PainSc:                  Lowella Curb

## 2018-07-18 NOTE — Discharge Instructions (Signed)
[facility] °LAPAROSCOPIC SURGERY: POST OP INSTRUCTIONS °Always review your discharge instruction sheet given to you by the facility where your surgery was performed. °IF YOU HAVE DISABILITY OR FAMILY LEAVE FORMS, YOU MUST BRING THEM TO THE OFFICE FOR PROCESSING.   °DO NOT GIVE THEM TO YOUR DOCTOR. ° °PAIN CONTROL ° °1. First take acetaminophen (Tylenol) AND/or ibuprofen (Advil) to control your pain after surgery.  Follow directions on package.  Taking acetaminophen (Tylenol) and/or ibuprofen (Advil) regularly after surgery will help to control your pain and lower the amount of prescription pain medication you may need.  You should not take more than 3,000 mg (3 grams) of acetaminophen (Tylenol) in 24 hours.  You should not take ibuprofen (Advil), aleve, motrin, naprosyn or other NSAIDS if you have a history of stomach ulcers or chronic kidney disease.  °2. A prescription for pain medication may be given to you upon discharge.  Take your pain medication as prescribed, if you still have uncontrolled pain after taking acetaminophen (Tylenol) or ibuprofen (Advil). °3. Use ice packs to help control pain. °4. If you need a refill on your pain medication, please contact your pharmacy.  They will contact our office to request authorization. Prescriptions will not be filled after 5pm or on week-ends. ° °HOME MEDICATIONS °5. Take your usually prescribed medications unless otherwise directed. ° °DIET °6. You should follow a light diet the first few days after arrival home.  Be sure to include lots of fluids daily. Avoid fatty, fried foods.  ° °CONSTIPATION °7. It is common to experience some constipation after surgery and if you are taking pain medication.  Increasing fluid intake and taking a stool softener (such as Colace) will usually help or prevent this problem from occurring.  A mild laxative (Milk of Magnesia or Miralax) should be taken according to package instructions if there are no bowel  movements after 48 hours. ° °WOUND/INCISION CARE °8. Most patients will experience some swelling and bruising in the area of the incisions.  Ice packs will help.  Swelling and bruising can take several days to resolve.  °9. Unless discharge instructions indicate otherwise, follow guidelines below  °a. STERI-STRIPS - you may remove your outer bandages 48 hours after surgery, and you may shower at that time.  You have steri-strips (small skin tapes) in place directly over the incision.  These strips should be left on the skin for 7-10 days.   °b. DERMABOND/SKIN GLUE - you may shower in 24 hours.  The glue will flake off over the next 2-3 weeks. °10. Any sutures or staples will be removed at the office during your follow-up visit. ° °ACTIVITIES °11. You may resume regular (light) daily activities beginning the next day--such as daily self-care, walking, climbing stairs--gradually increasing activities as tolerated.  You may have sexual intercourse when it is comfortable.  Refrain from any heavy lifting or straining until approved by your doctor. °a. You may drive when you are no longer taking prescription pain medication, you can comfortably wear a seatbelt, and you can safely maneuver your car and apply brakes. ° °FOLLOW-UP °12. You should see your doctor in the office for a follow-up appointment approximately 2-3 weeks after your surgery.  You should have been given your post-op/follow-up appointment when your surgery was scheduled.  If you did not receive a post-op/follow-up appointment, make sure that you call for this appointment within a day or two after you arrive home to insure a convenient appointment time. ° °OTHER   INSTRUCTIONS °13.  ° °WHEN TO CALL YOUR DOCTOR: °1. Fever over 101.0 °2. Inability to urinate °3. Continued bleeding from incision. °4. Increased pain, redness, or drainage from the incision. °5. Increasing abdominal pain ° °The clinic staff is available to answer your questions during regular  business hours.  Please don’t hesitate to call and ask to speak to one of the nurses for clinical concerns.  If you have a medical emergency, go to the nearest emergency room or call 911.  A surgeon from [facility] is always on call at the hospital. °[address], [address], [place], [place]  [address] ? [address] [address], , [place]   [address] °[phone] ? [phone] ? FAX [phone] °Web site: [email or web address] ° °

## 2018-07-18 NOTE — Transfer of Care (Signed)
Immediate Anesthesia Transfer of Care Note  Patient: Jacob Carr  Procedure(s) Performed: APPENDECTOMY LAPAROSCOPIC (N/A Abdomen)  Patient Location: PACU  Anesthesia Type:General  Level of Consciousness: awake, alert , oriented and patient cooperative  Airway & Oxygen Therapy: Patient Spontanous Breathing and Patient connected to face mask oxygen  Post-op Assessment: Report given to RN, Post -op Vital signs reviewed and stable and Patient moving all extremities  Post vital signs: Reviewed and stable  Last Vitals:  Vitals Value Taken Time  BP    Temp    Pulse 108 07/18/2018 10:36 AM  Resp 28 07/18/2018 10:36 AM  SpO2 94 % 07/18/2018 10:36 AM  Vitals shown include unvalidated device data.  Last Pain:  Vitals:   07/18/18 0852  TempSrc: Oral  PainSc:          Complications: No apparent anesthesia complications

## 2018-07-19 ENCOUNTER — Encounter (HOSPITAL_COMMUNITY): Payer: Self-pay | Admitting: Surgery

## 2018-07-19 MED ORDER — OXYCODONE HCL 5 MG PO TABS: 5.0000 mg | ORAL_TABLET | Freq: Four times a day (QID) | ORAL | 0 refills | Status: DC | PRN

## 2018-07-19 MED ORDER — IBUPROFEN 200 MG PO TABS: 600.0000 mg | ORAL_TABLET | Freq: Three times a day (TID) | ORAL | Status: DC | PRN

## 2018-07-19 MED ORDER — ACETAMINOPHEN 500 MG PO TABS: 1000.0000 mg | ORAL_TABLET | Freq: Four times a day (QID) | ORAL | 0 refills | Status: DC | PRN

## 2018-07-19 MED ORDER — IBUPROFEN 600 MG PO TABS: 200.0000 mg | ORAL_TABLET | Freq: Three times a day (TID) | ORAL | 0 refills | Status: DC | PRN

## 2018-07-19 MED ORDER — ACETAMINOPHEN 500 MG PO TABS
1000.0000 mg | ORAL_TABLET | Freq: Four times a day (QID) | ORAL | Status: DC
  Administered 2018-07-19: 1000 mg via ORAL
  Filled 2018-07-19: qty 2

## 2018-07-19 NOTE — Care Management Note (Signed)
Case Management Note  Patient Details  Name: Jacob Carr MRN: [id number] Date of Birth: 16-Oct-1985  Subjective/Objective:   Pt s/p lap appy. He is from home with family. Pt has recently relocated to [place] area for work. He doesn't have PCP. His parents are able to provide assistance at home after d/c and transportation as needed.  He denies any issues with obtaining medications.                 Action/Plan: Pt discharging home with self care. CM provided him resources for obtaining a PCP in this area.  Pt states his parents will provide transport home.  Expected Discharge Date:  07/19/18               Expected Discharge Plan:  Home/Self Care  In-House Referral:     Discharge planning Services     Post Acute Care Choice:    Choice offered to:     DME Arranged:    DME Agency:     HH Arranged:    HH Agency:     Status of Service:  Completed, signed off  If discussed at [employer] of Stay Meetings, dates discussed:    Additional Comments:  Kermit Balo, RN 07/19/2018, 10:54 AM

## 2018-07-19 NOTE — Discharge Summary (Signed)
     Patient ID: Jacob Carr [id number] [birth year] 32 y.o.  Admit date: 07/17/2018 Discharge date: 07/19/2018  Admitting Diagnosis: Acute appendicitis  Discharge Diagnosis Patient Active Problem List   Diagnosis Date Noted  . Acute appendicitis 07/18/2018    Consultants none  Reason for Admission: 32 yo male presenting to [facility] for surgical consult for appendicitis. Patient began experiencing abdominal pain that woke him from his sleep 2 nights ago. Patient says that the pain started on the right side and migrated to the RLQ. Patient claims that the pain subsided long enough for him to go back to sleep and go to work yesterday. Throughout the day at work, the pain became progressively worse. Patient tried taking pepto bismol which did not alleviate the pain. Patient also reports hot flashes and chills. Patient went to urgent care around 1940 yesterday and was told to go to the ED if symptoms did not improve with Zofran. Patient continued to have 8/10 abdominal pain and vomited, prompting him to go to the ED. Patient does not have a history of abdominal surgery.   Procedures Lap appy, Dr. Magnus Ivan [facility] Course:  The patient was admitted and underwent a laparoscopic appendectomy.  The patient tolerated the procedure well.  On POD 1, the patient was tolerating a regular diet, voiding well, mobilizing, and pain was controlled with oral pain medications.  The patient was stable for DC home at this time with appropriate follow up made.    Physical Exam: Abd: soft, appropriately tender, +BS, obese, incisions c/d/i with dermabond  Allergies as of 07/19/2018   No Known Allergies     Medication List    STOP taking these medications   ondansetron 4 MG tablet Commonly known as:  ZOFRAN     TAKE these medications   acetaminophen 500 MG tablet Commonly known as:  TYLENOL Take 2 tablets (1,000 mg total) by mouth every 6 (six) hours as needed.   bismuth  subsalicylate 262 MG/15ML suspension Commonly known as:  PEPTO BISMOL Take 30 mLs by mouth every 6 (six) hours as needed for indigestion.   ibuprofen 600 MG tablet Commonly known as:  ADVIL,MOTRIN Take 0.5-1 tablets (300-600 mg total) by mouth every 8 (eight) hours as needed for headache, mild pain or moderate pain.   oxyCODONE 5 MG immediate release tablet Commonly known as:  Oxy IR/ROXICODONE Take 1 tablet (5 mg total) by mouth every 6 (six) hours as needed for moderate pain or severe pain.        Follow-up Information    [facility], [place]. Go on 07/30/2018.   Specialty:  General Surgery Why:  at 11:45 AM for post-operative follow up appointment. please arrive 30 minutes early to get checked in and fill out any necessary paperwork.  Contact information: [address] [place] [address] [phone]          Signed: Barnetta Chapel, [facility] 07/19/2018, 9:16 AM Pager: [phone]

## 2018-11-12 ENCOUNTER — Other Ambulatory Visit: Payer: Self-pay

## 2018-11-12 ENCOUNTER — Encounter (HOSPITAL_COMMUNITY): Payer: Self-pay | Admitting: Emergency Medicine

## 2018-11-12 ENCOUNTER — Ambulatory Visit (HOSPITAL_COMMUNITY)
Admission: EM | Admit: 2018-11-12 | Discharge: 2018-11-12 | Disposition: A | Discharge status: Home / Self Care | Payer: BLUE CROSS/BLUE SHIELD | Attending: Family Medicine | Admitting: Family Medicine

## 2018-11-12 DIAGNOSIS — B349 Viral infection, unspecified: Secondary | ICD-10-CM | POA: Diagnosis not present

## 2018-11-12 MED ORDER — OSELTAMIVIR PHOSPHATE 75 MG PO CAPS: 75.0000 mg | ORAL_CAPSULE | Freq: Two times a day (BID) | ORAL | 0 refills | Status: DC

## 2018-11-12 NOTE — ED Triage Notes (Signed)
Woke today with chest congestion, headache, fever, sore throat.

## 2018-11-12 NOTE — ED Provider Notes (Signed)
[facility]    CSN: [id number] Arrival date & time: 11/12/18  1942     History   Chief Complaint Chief Complaint  Patient presents with  . URI    HPI Jacob Carr is a 33 y.o. male.   Onset today of chest congestion headache fever and sore throat.  Cough is nonproductive.  He denies myalgias but has other symptoms consistent with flulike illness.  Also noted some sensitivity to light today with onset of headache.  Coincidentally he is leaving off sugars and caffeine on a diet.  HPI  History reviewed. No pertinent past medical history.  Patient Active Problem List   Diagnosis Date Noted  . Acute appendicitis 07/18/2018    Past Surgical History:  Procedure Laterality Date  . LAPAROSCOPIC APPENDECTOMY N/A 07/18/2018   Procedure: APPENDECTOMY LAPAROSCOPIC;  Surgeon: Abigail Miyamoto, MD;  Location: [facility] OR;  Service: General;  Laterality: N/A;       Home Medications    Prior to Admission medications   Medication Sig Start Date End Date Taking? Authorizing Provider  ibuprofen (ADVIL,MOTRIN) 600 MG tablet Take 0.5-1 tablets (300-600 mg total) by mouth every 8 (eight) hours as needed for headache, mild pain or moderate pain. 07/19/18  Yes Barnetta Chapel, PA-C  Pseudoephedrine-APAP-DM (DAYQUIL PO) Take by mouth.   Yes [provider]  acetaminophen (TYLENOL) 500 MG tablet Take 2 tablets (1,000 mg total) by mouth every 6 (six) hours as needed. 07/19/18   Barnetta Chapel, PA-C  bismuth subsalicylate (PEPTO BISMOL) 262 MG/15ML suspension Take 30 mLs by mouth every 6 (six) hours as needed for indigestion.     [provider]  oseltamivir (TAMIFLU) 75 MG capsule Take 1 capsule (75 mg total) by mouth every 12 (twelve) hours. 11/12/18   Frederica Kuster, MD  oxyCODONE (OXY IR/ROXICODONE) 5 MG immediate release tablet Take 1 tablet (5 mg total) by mouth every 6 (six) hours as needed for moderate pain or severe pain. 07/19/18   Barnetta Chapel, PA-C    Family  History Family History  Problem Relation Age of Onset  . Hypertension Mother   . Bradycardia Father     Social History Social History   Tobacco Use  . Smoking status: Never Smoker  . Smokeless tobacco: Never Used  Substance Use Topics  . Alcohol use: Not Currently  . Drug use: Never     Allergies   Patient has no known allergies.   Review of Systems Review of Systems  HENT: Positive for congestion and sore throat.   Respiratory: Positive for cough.   Neurological: Positive for headaches.  All other systems reviewed and are negative.    Physical Exam Triage Vital Signs ED Triage Vitals  Enc Vitals Group     BP 11/12/18 2010 (!) 141/61     Pulse Rate 11/12/18 2010 91     Resp 11/12/18 2010 (!) 24     Temp 11/12/18 2010 99.2 F (37.3 C)     Temp Source 11/12/18 2010 Temporal     SpO2 11/12/18 2010 100 %     Weight --      Height --      Head Circumference --      Peak Flow --      Pain Score 11/12/18 2007 8     Pain Loc --      Pain Edu? --      Excl. in GC? --    No data found.  Updated Vital Signs BP (!) 141/61 (  BP Location: Right Arm) Comment (BP Location): large cuff  Pulse 91   Temp 99.2 F (37.3 C) (Temporal)   Resp (!) 24   SpO2 100%   Visual Acuity Right Eye Distance:   Left Eye Distance:   Bilateral Distance:    Right Eye Near:   Left Eye Near:    Bilateral Near:     Physical Exam Constitutional:      Appearance: Normal appearance. He is obese.  HENT:     Head: Normocephalic.     Right Ear: Tympanic membrane normal.     Left Ear: Tympanic membrane normal.     Nose: Nose normal.  Eyes:     Pupils: Pupils are equal, round, and reactive to light.  Neck:     Musculoskeletal: Normal range of motion and neck supple.  Cardiovascular:     Rate and Rhythm: Normal rate and regular rhythm.  Pulmonary:     Effort: Pulmonary effort is normal.     Breath sounds: Normal breath sounds.  Abdominal:     General: Bowel sounds are normal.      Palpations: Abdomen is soft.  Neurological:     General: No focal deficit present.     Mental Status: He is alert and oriented to person, place, and time.      UC Treatments / Results  Labs (all labs ordered are listed, but only abnormal results are displayed) Labs Reviewed - No data to display  EKG None  Radiology No results found.  Procedures Procedures (including critical care time)  Medications Ordered in UC Medications - No data to display  Initial Impression / Assessment and Plan / UC Course  I have reviewed the triage vital signs and the nursing notes.  Pertinent labs & imaging results that were available during my care of the patient were reviewed by me and considered in my medical decision making (see chart for details).     Flulike illness.  Since symptoms just began today will offer Tamiflu.  Also use DayQuil NyQuil Tylenol or ibuprofen for fever and headache Final Clinical Impressions(s) / UC Diagnoses   Final diagnoses:  Viral syndrome   Discharge Instructions   None    ED Prescriptions    Medication Sig Dispense Auth. Provider   oseltamivir (TAMIFLU) 75 MG capsule Take 1 capsule (75 mg total) by mouth every 12 (twelve) hours. 10 capsule Frederica Kuster, MD     Controlled Substance Prescriptions [place] Controlled Substance Registry consulted? No   Frederica Kuster, MD 11/12/18 2034

## 2018-11-15 ENCOUNTER — Encounter (HOSPITAL_COMMUNITY): Payer: Self-pay | Admitting: Emergency Medicine

## 2018-11-15 ENCOUNTER — Emergency Department (HOSPITAL_COMMUNITY)
Admission: EM | Admit: 2018-11-15 | Discharge: 2018-11-15 | Disposition: A | Discharge status: Home / Self Care | Payer: BLUE CROSS/BLUE SHIELD | Attending: Emergency Medicine | Admitting: Emergency Medicine

## 2018-11-15 ENCOUNTER — Other Ambulatory Visit: Payer: Self-pay

## 2018-11-15 ENCOUNTER — Emergency Department (HOSPITAL_COMMUNITY): Payer: BLUE CROSS/BLUE SHIELD

## 2018-11-15 DIAGNOSIS — Z79899 Other long term (current) drug therapy: Secondary | ICD-10-CM | POA: Diagnosis not present

## 2018-11-15 DIAGNOSIS — J101 Influenza due to other identified influenza virus with other respiratory manifestations: Secondary | ICD-10-CM | POA: Insufficient documentation

## 2018-11-15 DIAGNOSIS — J111 Influenza due to unidentified influenza virus with other respiratory manifestations: Secondary | ICD-10-CM

## 2018-11-15 DIAGNOSIS — J029 Acute pharyngitis, unspecified: Secondary | ICD-10-CM | POA: Diagnosis present

## 2018-11-15 MED ORDER — BENZONATATE 100 MG PO CAPS: 100.0000 mg | ORAL_CAPSULE | Freq: Three times a day (TID) | ORAL | 0 refills | Status: DC

## 2018-11-15 MED ORDER — IBUPROFEN 800 MG PO TABS: 800.0000 mg | ORAL_TABLET | Freq: Three times a day (TID) | ORAL | 0 refills | Status: DC

## 2018-11-15 NOTE — ED Notes (Signed)
Patient returned to room from x-ray.

## 2018-11-15 NOTE — ED Provider Notes (Signed)
[facility] EMERGENCY DEPARTMENT Provider Note   CSN: [id number] Arrival date & time: 11/15/18  0544    History   Chief Complaint Chief Complaint  Patient presents with  . Sore Throat  . Chest Pain  . flu symptoms    HPI Jacob Carr is a 33 y.o. male.     The history is provided by the patient. No language interpreter was used.  Sore Throat  This is a new problem. The problem occurs constantly. The problem has been gradually worsening. Associated symptoms include chest pain. Nothing aggravates the symptoms. He has tried nothing for the symptoms. The treatment provided no relief.  Chest Pain  Associated symptoms: cough     No past medical history on file.  Patient Active Problem List   Diagnosis Date Noted  . Acute appendicitis 07/18/2018    Past Surgical History:  Procedure Laterality Date  . LAPAROSCOPIC APPENDECTOMY N/A 07/18/2018   Procedure: APPENDECTOMY LAPAROSCOPIC;  Surgeon: Abigail Miyamoto, MD;  Location: [facility] OR;  Service: General;  Laterality: N/A;  . TONSILLECTOMY          Home Medications    Prior to Admission medications   Medication Sig Start Date End Date Taking? Authorizing Provider  oseltamivir (TAMIFLU) 75 MG capsule Take 1 capsule (75 mg total) by mouth every 12 (twelve) hours. 11/12/18  Yes Frederica Kuster, MD  Pseudoephedrine-APAP-DM (DAYQUIL PO) Take 1 capsule by mouth 2 (two) times daily as needed (for cough).    Yes [provider]  acetaminophen (TYLENOL) 500 MG tablet Take 2 tablets (1,000 mg total) by mouth every 6 (six) hours as needed. Patient not taking: Reported on 11/15/2018 07/19/18   Barnetta Chapel, PA-C  ibuprofen (ADVIL,MOTRIN) 600 MG tablet Take 0.5-1 tablets (300-600 mg total) by mouth every 8 (eight) hours as needed for headache, mild pain or moderate pain. Patient not taking: Reported on 11/15/2018 07/19/18   Barnetta Chapel, PA-C  oxyCODONE (OXY IR/ROXICODONE) 5 MG immediate release tablet Take 1  tablet (5 mg total) by mouth every 6 (six) hours as needed for moderate pain or severe pain. Patient not taking: Reported on 11/15/2018 07/19/18   Barnetta Chapel, PA-C    Family History Family History  Problem Relation Age of Onset  . Hypertension Mother   . Bradycardia Father     Social History Social History   Tobacco Use  . Smoking status: Never Smoker  . Smokeless tobacco: Never Used  Substance Use Topics  . Alcohol use: Not Currently  . Drug use: Never     Allergies   Patient has no known allergies.   Review of Systems Review of Systems  Respiratory: Positive for cough. Negative for wheezing.   Cardiovascular: Positive for chest pain.  All other systems reviewed and are negative.    Physical Exam Updated Vital Signs BP (!) 157/81 (BP Location: Left Arm)   Pulse 95   Temp 99.1 F (37.3 C) (Oral)   Resp (!) 26   Ht 6\' 5"  (1.956 m)   Wt (!) 163.3 kg   SpO2 97%   BMI 42.69 kg/m   Physical Exam Vitals signs and nursing note reviewed.  Constitutional:      Appearance: He is well-developed.  HENT:     Head: Normocephalic.     Right Ear: Tympanic membrane normal.     Left Ear: Tympanic membrane normal.     Mouth/Throat:     Mouth: Mucous membranes are moist.  Eyes:     Conjunctiva/sclera:  Conjunctivae normal.  Neck:     Musculoskeletal: Normal range of motion.  Cardiovascular:     Rate and Rhythm: Normal rate.  Pulmonary:     Effort: Pulmonary effort is normal.  Abdominal:     General: There is no distension.  Musculoskeletal: Normal range of motion.  Skin:    General: Skin is warm.  Neurological:     Mental Status: He is alert and oriented to person, place, and time.      ED Treatments / Results  Labs (all labs ordered are listed, but only abnormal results are displayed) Labs Reviewed - No data to display  EKG EKG Interpretation  Date/Time:  Friday November 15 2018 05:52:08 EST Ventricular Rate:  72 PR Interval:    QRS  Duration: 94 QT Interval:  366 QTC Calculation: 401 R Axis:   78 Text Interpretation:  Sinus rhythm Minimal ST depression, inferior leads No old tracing to compare Confirmed by Dione Booze ([id number]) on 11/15/2018 5:55:44 AM   Radiology Dg Chest 2 View  Result Date: 11/15/2018 CLINICAL DATA:  Cough and congestion EXAM: CHEST - 2 VIEW COMPARISON:  None. FINDINGS: Low volume chest. There is no edema, consolidation, effusion, or pneumothorax. Normal heart size for technique. Artifact from EKG leads. IMPRESSION: Negative low volume chest. Electronically Signed   By: Marnee Spring M.D.   On: 11/15/2018 06:39    Procedures Procedures (including critical care time)  Medications Ordered in ED Medications - No data to display   Initial Impression / Assessment and Plan / ED Course  I have reviewed the triage vital signs and the nursing notes.  Pertinent labs & imaging results that were available during my care of the patient were reviewed by me and considered in my medical decision making (see chart for details).        MDM  Pt is on 4th day of cough and congestion.  Pt began tamiflu 3 days ago.  Pt advised to continue tamiflu.  Pt given rx for ibuprofen and tessalon  Final Clinical Impressions(s) / ED Diagnoses   Final diagnoses:  Influenza    ED Discharge Orders         Ordered    benzonatate (TESSALON) 100 MG capsule  Every 8 hours     11/15/18 0655    ibuprofen (ADVIL,MOTRIN) 800 MG tablet  3 times daily     11/15/18 4259        An After Visit Summary was printed and given to the patient.    Elson Areas, [place] 11/15/18 5638    Dione Booze, MD 11/15/18 2234

## 2018-11-15 NOTE — ED Notes (Signed)
Patient left room for xray 

## 2018-11-15 NOTE — Discharge Instructions (Signed)
Return if any problems.

## 2018-11-15 NOTE — ED Notes (Signed)
Patient transported to X-ray 

## 2018-11-15 NOTE — ED Triage Notes (Signed)
Patient presents to ED with complaints of blood in sputum when coughing. Patient states he has been feeling like he had the flu since Tuesday. He went to urgent care they diagnosed him based off symptoms and gave him tamiflu. Patient took tamiflu as prescribed. Patient complaining of constant chest pain that gets worse with coughing. Chest pain that is a 7/10 and throbbing or stabbing. Chest pain began on Tuesday. Some shortness of breath reported.

## 2019-04-29 ENCOUNTER — Other Ambulatory Visit: Payer: Self-pay

## 2019-04-29 ENCOUNTER — Ambulatory Visit (HOSPITAL_COMMUNITY)
Admission: EM | Admit: 2019-04-29 | Discharge: 2019-04-29 | Disposition: A | Discharge status: Home / Self Care | Payer: BC Managed Care – PPO | Attending: Internal Medicine | Admitting: Internal Medicine

## 2019-04-29 ENCOUNTER — Encounter (HOSPITAL_COMMUNITY): Payer: Self-pay

## 2019-04-29 DIAGNOSIS — R1013 Epigastric pain: Secondary | ICD-10-CM | POA: Insufficient documentation

## 2019-04-29 LAB — COMPREHENSIVE METABOLIC PANEL
ALT: 46 U/L — ABNORMAL HIGH (ref 0–44)
AST: 41 U/L (ref 15–41)
Albumin: 4.8 g/dL (ref 3.5–5.0)
Alkaline Phosphatase: 64 U/L (ref 38–126)
Anion gap: 13 (ref 5–15)
BUN: 18 mg/dL (ref 6–20)
CO2: 24 mmol/L (ref 22–32)
Calcium: 9.9 mg/dL (ref 8.9–10.3)
Chloride: 103 mmol/L (ref 98–111)
Creatinine, Ser: 1.09 mg/dL (ref 0.61–1.24)
GFR calc Af Amer: >60 mL/min (ref >60)
GFR calc non Af Amer: >60 mL/min (ref >60)
Glucose, Bld: 83 mg/dL (ref 70–99)
Potassium: 4 mmol/L (ref 3.5–5.1)
Sodium: 140 mmol/L (ref 135–145)
Total Bilirubin: 1.1 mg/dL (ref 0.3–1.2)
Total Protein: 7.5 g/dL (ref 6.5–8.1)

## 2019-04-29 LAB — LIPASE, BLOOD: Lipase: 40 U/L (ref 11–51)

## 2019-04-29 LAB — CBC
HCT: 48.4 % (ref 39.0–52.0)
Hemoglobin: 16.9 g/dL (ref 13.0–17.0)
MCH: 31.3 pg (ref 26.0–34.0)
MCHC: 34.9 g/dL (ref 30.0–36.0)
MCV: 89.6 fL (ref 80.0–100.0)
Platelets: 280 10*3/uL (ref 150–400)
RBC: 5.4 MIL/uL (ref 4.22–5.81)
RDW: 12.7 % (ref 11.5–15.5)
WBC: 8.5 10*3/uL (ref 4.0–10.5)
nRBC: 0 % (ref 0.0–0.2)

## 2019-04-29 MED ORDER — OMEPRAZOLE 20 MG PO CPDR: 20.0000 mg | DELAYED_RELEASE_CAPSULE | Freq: Every day | ORAL | 0 refills | Status: DC

## 2019-04-29 MED ORDER — LIDOCAINE VISCOUS HCL 2 % MT SOLN
15.0000 mL | Freq: Once | OROMUCOSAL | Status: AC
  Administered 2019-04-29: 15 mL via ORAL

## 2019-04-29 MED ORDER — ALUM & MAG HYDROXIDE-SIMETH 200-200-20 MG/5ML PO SUSP
ORAL | Status: AC
  Filled 2019-04-29: qty 30

## 2019-04-29 MED ORDER — ALUM & MAG HYDROXIDE-SIMETH 200-200-20 MG/5ML PO SUSP
30.0000 mL | Freq: Once | ORAL | Status: AC
  Administered 2019-04-29: 30 mL via ORAL

## 2019-04-29 MED ORDER — SIMETHICONE 80 MG PO TABS: 80.0000 mg | ORAL_TABLET | Freq: Four times a day (QID) | ORAL | 0 refills | Status: DC | PRN

## 2019-04-29 MED ORDER — LIDOCAINE VISCOUS HCL 2 % MT SOLN
OROMUCOSAL | Status: AC
  Filled 2019-04-29: qty 15

## 2019-04-29 NOTE — ED Triage Notes (Signed)
Pt presents with epigastric abdominal pain since this morning.

## 2019-04-29 NOTE — Discharge Instructions (Signed)
Your EKG was normal We are checking blood work to check your pancreas, liver/gallbladder -I will call if these labs are abnormal In the meantime please begin daily omeprazole/Prilosec over the next 1 to 2 weeks May try Gas-X to help with gas Drink plenty of fluids, incorporate fiber into diet to help with any constipation  Please follow-up in the emergency room if abdominal pain worsening, developing vomiting, fevers

## 2019-04-29 NOTE — ED Provider Notes (Signed)
[facility]    CSN: [id number] Arrival date & time: 04/29/19  [id number]      History   Chief Complaint Chief Complaint  Patient presents with  . Abdominal Pain    HPI Jacob Carr is a 33 y.o. male history of previous appendectomy presenting today for evaluation of upper abdominal pain.  Patient states that he has had a sharp stabbing pain that initially was intermittent, but this evening has become more persistent over the past hour.  Denies associated nausea vomiting or diarrhea.  Denies change in bowel movements.  Last bowel movement was this morning was normal.  Denies straining.  Denies radiation to back.  Denies history of reflux, denies history of underlying GI issues.  Denies significant use of alcohol or daily NSAID use.  Denies blood in stool.  Denies fevers.  Denies chest pain or shortness of breath.  Has history of mild hypertension, but is not currently on medicine.  He denies history of diabetes.  Denies tobacco use.  Pain worsens with movement.  Ate earlier today before pain started without issue.  Has drinking fluids which did not worsen pain. HPI  History reviewed. No pertinent past medical history.  Patient Active Problem List   Diagnosis Date Noted  . Acute appendicitis 07/18/2018    Past Surgical History:  Procedure Laterality Date  . LAPAROSCOPIC APPENDECTOMY N/A 07/18/2018   Procedure: APPENDECTOMY LAPAROSCOPIC;  Surgeon: Abigail MiyamotoBlackman, Douglas, MD;  Location: [facility] OR;  Service: General;  Laterality: N/A;  . TONSILLECTOMY         Home Medications    Prior to Admission medications   Medication Sig Start Date End Date Taking? Authorizing Provider  ibuprofen (ADVIL,MOTRIN) 800 MG tablet Take 1 tablet (800 mg total) by mouth 3 (three) times daily. 11/15/18   Elson AreasSofia, Leslie K, PA-C  omeprazole (PRILOSEC) 20 MG capsule Take 1 capsule (20 mg total) by mouth daily for 14 days. 04/29/19 05/13/19  Wieters, Hallie C, PA-C  Pseudoephedrine-APAP-DM (DAYQUIL PO) Take 1  capsule by mouth 2 (two) times daily as needed (for cough).     [provider]  Simethicone 80 MG TABS Take 1 tablet (80 mg total) by mouth 4 (four) times daily as needed. 04/29/19   Wieters, Junius CreamerHallie C, PA-C    Family History Family History  Problem Relation Age of Onset  . Hypertension Mother   . Bradycardia Father     Social History Social History   Tobacco Use  . Smoking status: Never Smoker  . Smokeless tobacco: Never Used  Substance Use Topics  . Alcohol use: Not Currently  . Drug use: Never     Allergies   Patient has no known allergies.   Review of Systems Review of Systems  Constitutional: Negative for activity change, appetite change and fever.  HENT: Negative for sore throat.   Respiratory: Negative for shortness of breath.   Cardiovascular: Negative for chest pain.  Gastrointestinal: Positive for abdominal pain. Negative for constipation, diarrhea, nausea and vomiting.  Genitourinary: Negative for difficulty urinating, discharge, dysuria, frequency, penile pain, penile swelling, scrotal swelling and testicular pain.  Skin: Negative for rash.  Neurological: Negative for dizziness, light-headedness and headaches.     Physical Exam Triage Vital Signs ED Triage Vitals  Enc Vitals Group     BP 04/29/19 1841 (!) 144/81     Pulse Rate 04/29/19 1841 88     Resp 04/29/19 1841 16     Temp 04/29/19 1841 98.1 F (36.7 C)  Temp Source 04/29/19 1841 Oral     SpO2 04/29/19 1841 91 %     Weight --      Height --      Head Circumference --      Peak Flow --      Pain Score 04/29/19 1842 9     Pain Loc --      Pain Edu? --      Excl. in [place]? --    No data found.  Updated Vital Signs BP (!) 144/81 (BP Location: Left Arm)   Pulse 88   Temp 98.1 F (36.7 C) (Oral)   Resp 16   SpO2 91%   Visual Acuity Right Eye Distance:   Left Eye Distance:   Bilateral Distance:    Right Eye Near:   Left Eye Near:    Bilateral Near:     Physical Exam  Vitals signs and nursing note reviewed.  Constitutional:      Appearance: He is well-developed.  HENT:     Head: Normocephalic and atraumatic.     Mouth/Throat:     Comments: Oral mucosa pink and moist, no tonsillar enlargement or exudate. Posterior pharynx patent and nonerythematous, no uvula deviation or swelling. Normal phonation. Eyes:     Conjunctiva/sclera: Conjunctivae normal.  Neck:     Musculoskeletal: Neck supple.  Cardiovascular:     Rate and Rhythm: Normal rate and regular rhythm.     Heart sounds: No murmur.  Pulmonary:     Effort: Pulmonary effort is normal. No respiratory distress.     Breath sounds: Normal breath sounds.     Comments: Breathing comfortably at rest, CTABL, no wheezing, rales or other adventitious sounds auscultated Abdominal:     Palpations: Abdomen is soft.     Tenderness: There is abdominal tenderness.     Comments: Soft, nondistended, tenderness to palpation in epigastrium and left upper quadrant, nontender in right upper quadrant and bilateral lower quadrants; negative rebound, negative Rovsing, negative McBurney's, negative Murphy's  Leaning backward and slightly towards right side to help with discomfort  Skin:    General: Skin is warm and dry.  Neurological:     Mental Status: He is alert.      UC Treatments / Results  Labs (all labs ordered are listed, but only abnormal results are displayed) Labs Reviewed  CBC  COMPREHENSIVE METABOLIC PANEL  LIPASE, BLOOD    EKG   Radiology No results found.  Procedures Procedures (including critical care time)  Medications Ordered in UC Medications  alum & mag hydroxide-simeth (MAALOX/MYLANTA) 200-200-20 MG/5ML suspension 30 mL (30 mLs Oral Given 04/29/19 1916)    And  lidocaine (XYLOCAINE) 2 % viscous mouth solution 15 mL (15 mLs Oral Given 04/29/19 1916)  alum & mag hydroxide-simeth (MAALOX/MYLANTA) 200-200-20 MG/5ML suspension (has no administration in time range)  lidocaine (XYLOCAINE)  2 % viscous mouth solution (has no administration in time range)    Initial Impression / Assessment and Plan / UC Course  I have reviewed the triage vital signs and the nursing notes.  Pertinent labs & imaging results that were available during my care of the patient were reviewed by me and considered in my medical decision making (see chart for details).     EKG normal sinus rhythm with PVC.  No acute signs of ischemia or infarction.  GI cocktail provided with no significant improvement of symptoms.  Did belch with slight improvement of discomfort.  Given tenderness to epigastrium and centrally will check lipase,  CBC and CMP.  Will call with results if abnormal and provide further recommendations.  In the meantime will initiate on daily omeprazole and try Gas-X as needed.  No fever, no radiation to back.  We will continue to monitor, recommended follow-up in ED if developing worsening pain, vomiting or fevers.Discussed strict return precautions. Patient verbalized understanding and is agreeable with plan.  Final Clinical Impressions(s) / UC Diagnoses   Final diagnoses:  Epigastric pain     Discharge Instructions     Your EKG was normal We are checking blood work to check your pancreas, liver/gallbladder -I will call if these labs are abnormal In the meantime please begin daily omeprazole/Prilosec over the next 1 to 2 weeks May try Gas-X to help with gas Drink plenty of fluids, incorporate fiber into diet to help with any constipation  Please follow-up in the emergency room if abdominal pain worsening, developing vomiting, fevers   ED Prescriptions    Medication Sig Dispense Auth. Provider   omeprazole (PRILOSEC) 20 MG capsule Take 1 capsule (20 mg total) by mouth daily for 14 days. 14 capsule Wieters, Hallie C, PA-C   Simethicone 80 MG TABS Take 1 tablet (80 mg total) by mouth 4 (four) times daily as needed. 24 tablet Wieters, [place] C, PA-C     Controlled Substance Prescriptions   Controlled Substance Registry consulted? Not Applicable   Janith Lima, [place] 04/29/19 2006

## 2019-08-16 ENCOUNTER — Other Ambulatory Visit: Payer: Self-pay

## 2019-08-16 ENCOUNTER — Encounter (HOSPITAL_COMMUNITY): Payer: Self-pay | Admitting: *Deleted

## 2019-08-16 ENCOUNTER — Ambulatory Visit (HOSPITAL_COMMUNITY)
Admission: EM | Admit: 2019-08-16 | Discharge: 2019-08-16 | Disposition: A | Discharge status: Home / Self Care | Payer: BC Managed Care – PPO | Attending: Physician Assistant | Admitting: Physician Assistant

## 2019-08-16 DIAGNOSIS — Z20822 Contact with and (suspected) exposure to covid-19: Secondary | ICD-10-CM

## 2019-08-16 DIAGNOSIS — Z20828 Contact with and (suspected) exposure to other viral communicable diseases: Secondary | ICD-10-CM | POA: Diagnosis present

## 2019-08-16 NOTE — ED Provider Notes (Signed)
[place]    CSN: [id number] Arrival date & time: 08/16/19  1322      History   Chief Complaint Chief Complaint  Patient presents with  . covid exposure    HPI Jacob Carr is a 33 y.o. male.   Pt reports he was exposed to a coworker who had covid 5 days ago.  Pt denies any symptoms.  Pt wants to get tested.   The history is provided by the patient. No language interpreter was used.    History reviewed. No pertinent past medical history.  Patient Active Problem List   Diagnosis Date Noted  . Acute appendicitis 07/18/2018    Past Surgical History:  Procedure Laterality Date  . APPENDECTOMY    . LAPAROSCOPIC APPENDECTOMY N/A 07/18/2018   Procedure: APPENDECTOMY LAPAROSCOPIC;  Surgeon: Coralie Keens, MD;  Location: [facility];  Service: General;  Laterality: N/A;  . TONSILLECTOMY         Home Medications    Prior to Admission medications   Medication Sig Start Date End Date Taking? Authorizing Provider  Loratadine (CLARITIN PO) Take by mouth.   Yes [provider]  ibuprofen (ADVIL,MOTRIN) 800 MG tablet Take 1 tablet (800 mg total) by mouth 3 (three) times daily. 11/15/18   Fransico Meadow, PA-C  omeprazole (PRILOSEC) 20 MG capsule Take 1 capsule (20 mg total) by mouth daily for 14 days. 04/29/19 05/13/19  Wieters, Hallie C, PA-C  Pseudoephedrine-APAP-DM (DAYQUIL PO) Take 1 capsule by mouth 2 (two) times daily as needed (for cough).     [provider]  Simethicone 80 MG TABS Take 1 tablet (80 mg total) by mouth 4 (four) times daily as needed. 04/29/19   Wieters, Elesa Hacker, PA-C    Family History Family History  Problem Relation Age of Onset  . Hypertension Mother   . Bradycardia Father     Social History Social History   Tobacco Use  . Smoking status: Never Smoker  . Smokeless tobacco: Never Used  Substance Use Topics  . Alcohol use: Not Currently  . Drug use: Never     Allergies   Patient has no known allergies.    Review of Systems Review of Systems  All other systems reviewed and are negative.    Physical Exam Triage Vital Signs ED Triage Vitals  Enc Vitals Group     BP 08/16/19 1518 133/66     Pulse Rate 08/16/19 1518 100     Resp 08/16/19 1518 20     Temp 08/16/19 1518 98.8 F (37.1 C)     Temp src --      SpO2 08/16/19 1518 97 %     Weight --      Height --      Head Circumference --      Peak Flow --      Pain Score 08/16/19 1519 0     Pain Loc --      Pain Edu? --      Excl. in [place]? --    No data found.  Updated Vital Signs BP 133/66   Pulse 100   Temp 98.8 F (37.1 C)   Resp 20   SpO2 97%   Visual Acuity Right Eye Distance:   Left Eye Distance:   Bilateral Distance:    Right Eye Near:   Left Eye Near:    Bilateral Near:     Physical Exam Vitals signs and nursing note reviewed.  Constitutional:  Appearance: He is well-developed.  HENT:     Head: Normocephalic.  Neck:     Musculoskeletal: Normal range of motion.  Cardiovascular:     Rate and Rhythm: Normal rate.  Pulmonary:     Effort: Pulmonary effort is normal.  Abdominal:     General: There is no distension.  Musculoskeletal: Normal range of motion.  Skin:    General: Skin is warm.  Neurological:     General: No focal deficit present.     Mental Status: He is alert and oriented to person, place, and time.  Psychiatric:        Mood and Affect: Mood normal.      UC Treatments / Results  Labs (all labs ordered are listed, but only abnormal results are displayed) Labs Reviewed  SARS CORONAVIRUS 2 (TAT 6-24 HRS)    EKG   Radiology No results found.  Procedures Procedures (including critical care time)  Medications Ordered in UC Medications - No data to display  Initial Impression / Assessment and Plan / UC Course  I have reviewed the triage vital signs and the nursing notes.  Pertinent labs & imaging results that were available during my care of the patient were reviewed by me  and considered in my medical decision making (see chart for details).     Covid test ordered.  Pt counseled on covid.  Final Clinical Impressions(s) / UC Diagnoses   Final diagnoses:  Exposure to COVID-19 virus   Discharge Instructions   None    ED Prescriptions    None     PDMP not reviewed this encounter.  An After Visit Summary was printed and given to the patient.    Elson Areas, [place] 08/16/19 1555

## 2019-08-16 NOTE — ED Triage Notes (Signed)
Pt reports having Covid exposure with coworker.  Denies any sxs; states had a minor HA this AM "but I think it's from something else", which has since resolved.

## 2019-08-17 LAB — NOVEL CORONAVIRUS, NAA (HOSP ORDER, SEND-OUT TO REF LAB; TAT 18-24 HRS): SARS-CoV-2, NAA: NOT DETECTED

## 2019-08-24 ENCOUNTER — Other Ambulatory Visit: Payer: Self-pay

## 2019-08-24 ENCOUNTER — Encounter (HOSPITAL_COMMUNITY): Payer: Self-pay | Admitting: Emergency Medicine

## 2019-08-24 ENCOUNTER — Ambulatory Visit (HOSPITAL_COMMUNITY)
Admission: EM | Admit: 2019-08-24 | Discharge: 2019-08-24 | Disposition: A | Discharge status: Home / Self Care | Payer: BC Managed Care – PPO | Attending: Family Medicine | Admitting: Family Medicine

## 2019-08-24 DIAGNOSIS — U071 COVID-19: Secondary | ICD-10-CM | POA: Diagnosis not present

## 2019-08-24 DIAGNOSIS — R05 Cough: Secondary | ICD-10-CM | POA: Insufficient documentation

## 2019-08-24 DIAGNOSIS — Z79899 Other long term (current) drug therapy: Secondary | ICD-10-CM | POA: Diagnosis not present

## 2019-08-24 DIAGNOSIS — B349 Viral infection, unspecified: Secondary | ICD-10-CM | POA: Diagnosis not present

## 2019-08-24 MED ORDER — PROMETHAZINE-DM 6.25-15 MG/5ML PO SYRP: 5.0000 mL | ORAL_SOLUTION | Freq: Four times a day (QID) | ORAL | 0 refills | Status: DC | PRN

## 2019-08-24 NOTE — ED Triage Notes (Signed)
Pt c/o cough x1 week, chills, fatigue, chest congestion, states he was exposed to covid on the 18th, states he had a negative test on the 21st but symptoms have gotten worse.

## 2019-08-24 NOTE — Discharge Instructions (Addendum)
Take medications as prescribed. You may take tylenol or ibuprofen as needed for fevers/headache/body aches. Drink plenty of fluids. Stay in home isolation until you receive results of your COVID test. You will only be notified for positive results. You may go online to MyChart in the next few days and review your results. Please follow CDC guidelines that are attached. You may discontinue home isolation when there has been at least 10 days since symptoms onset AND 3 days fever free without antipyretics (Tylenol or Ibuprofen) AND an overall improvement in your symptoms. Go to the ED immediately if you get worse or have any other symptoms.   Feel better soon!  Kadeja Granada, FNP-C   

## 2019-08-24 NOTE — ED Provider Notes (Signed)
[facility]    CSN: [id number] Arrival date & time: 08/24/19  1110      History   Chief Complaint Chief Complaint  Patient presents with  . Appointment    1120  . Cough    HPI Jacob Carr is a 33 y.o. male.   Subjective:   Jacob Carr is a 33 y.o. male who presents for evaluation of influenza/COVID like symptoms. Symptoms include low grade fevers, hot and cold spells, productive cough and sore throat and have been present for 5 days. Notably, the patient has had COVID exposure on 11/18. He was tested on 11/21 but did not have any symptoms at that time. His COVID test was negative and the above symptoms started on 11/24. He has tried to alleviate the symptoms with nothing. High risk factors for COVID complications: none.  The following portions of the patient's history were reviewed and updated as appropriate: allergies, current medications, past family history, past medical history, past social history, past surgical history and problem list.        History reviewed. No pertinent past medical history.  Patient Active Problem List   Diagnosis Date Noted  . Acute appendicitis 07/18/2018    Past Surgical History:  Procedure Laterality Date  . APPENDECTOMY    . LAPAROSCOPIC APPENDECTOMY N/A 07/18/2018   Procedure: APPENDECTOMY LAPAROSCOPIC;  Surgeon: Abigail Miyamoto, MD;  Location: [facility] OR;  Service: General;  Laterality: N/A;  . TONSILLECTOMY         Home Medications    Prior to Admission medications   Medication Sig Start Date End Date Taking? Authorizing Provider  ibuprofen (ADVIL,MOTRIN) 800 MG tablet Take 1 tablet (800 mg total) by mouth 3 (three) times daily. 11/15/18   Elson Areas, PA-C  Loratadine (CLARITIN PO) Take by mouth.    [provider]  omeprazole (PRILOSEC) 20 MG capsule Take 1 capsule (20 mg total) by mouth daily for 14 days. 04/29/19 05/13/19  Wieters, Hallie C, PA-C  promethazine-dextromethorphan (PROMETHAZINE-DM) 6.25-15  MG/5ML syrup Take 5 mLs by mouth 4 (four) times daily as needed for cough. 08/24/19   Lurline Idol, FNP  Pseudoephedrine-APAP-DM (DAYQUIL PO) Take 1 capsule by mouth 2 (two) times daily as needed (for cough).     [provider]  Simethicone 80 MG TABS Take 1 tablet (80 mg total) by mouth 4 (four) times daily as needed. 04/29/19   Wieters, Junius Creamer, PA-C    Family History Family History  Problem Relation Age of Onset  . Hypertension Mother   . Bradycardia Father     Social History Social History   Tobacco Use  . Smoking status: Never Smoker  . Smokeless tobacco: Never Used  Substance Use Topics  . Alcohol use: Not Currently  . Drug use: Never     Allergies   Patient has no known allergies.   Review of Systems Review of Systems  Constitutional: Positive for chills and fever. Negative for diaphoresis and fatigue.  HENT: Positive for congestion and sore throat.   Respiratory: Positive for cough. Negative for shortness of breath.   Cardiovascular: Negative for chest pain.  Gastrointestinal: Negative for diarrhea, nausea and vomiting.  Musculoskeletal: Negative for myalgias.  Neurological: Negative for dizziness and headaches.     Physical Exam Triage Vital Signs ED Triage Vitals [08/24/19 1135]  Enc Vitals Group     BP 137/80     Pulse Rate (!) 102     Resp 20     Temp 99.3 F (  37.4 C)     Temp src      SpO2 99 %     Weight      Height      Head Circumference      Peak Flow      Pain Score      Pain Loc      Pain Edu?      Excl. in [place]?    No data found.  Updated Vital Signs BP 137/80   Pulse (!) 102   Temp 99.3 F (37.4 C)   Resp 20   SpO2 99%   Visual Acuity Right Eye Distance:   Left Eye Distance:   Bilateral Distance:    Right Eye Near:   Left Eye Near:    Bilateral Near:     Physical Exam Vitals signs reviewed.  Constitutional:      General: He is not in acute distress.    Appearance: Normal appearance. He is obese. He is  not ill-appearing, toxic-appearing or diaphoretic.  HENT:     Head: Normocephalic.  Neck:     Musculoskeletal: Normal range of motion and neck supple.  Cardiovascular:     Rate and Rhythm: Normal rate and regular rhythm.  Pulmonary:     Effort: Pulmonary effort is normal.     Breath sounds: Normal breath sounds.  Musculoskeletal: Normal range of motion.  Skin:    General: Skin is warm and dry.  Neurological:     General: No focal deficit present.     Mental Status: He is alert and oriented to person, place, and time.      UC Treatments / Results  Labs (all labs ordered are listed, but only abnormal results are displayed) Labs Reviewed  NOVEL CORONAVIRUS, NAA (HOSP ORDER, SEND-OUT TO REF LAB; TAT 18-24 HRS)    EKG   Radiology No results found.  Procedures Procedures (including critical care time)  Medications Ordered in UC Medications - No data to display  Initial Impression / Assessment and Plan / UC Course  I have reviewed the triage vital signs and the nursing notes.  Pertinent labs & imaging results that were available during my care of the patient were reviewed by me and considered in my medical decision making (see chart for details).    33 yo male with known Covid exposure on 11/18 with a negative COVID test on 11/21 now presents with a 5-day history of low-grade fevers, hot and cold spells, productive cough and sore throat.  Patient was asymptomatic at the time of the previous COVID testing.  Patient has a low-grade fever.  He is nontoxic-appearing.  Physical exam unremarkable.  COVID-19 test pending Supportive care with appropriate antipyretics and fluids. Educational material distributed and questions answered.  Today's evaluation has revealed no signs of a dangerous process. Discussed diagnosis with patient and/or guardian. Patient and/or guardian aware of their diagnosis, possible red flag symptoms to watch out for and need for close follow up. Patient  and/or guardian understands verbal and written discharge instructions. Patient and/or guardian comfortable with plan and disposition.  Patient and/or guardian has a clear mental status at this time, good insight into illness (after discussion and teaching) and has clear judgment to make decisions regarding their care  This care was provided during an unprecedented National Emergency due to the Novel Coronavirus (COVID-19) pandemic. COVID-19 infections and transmission risks place heavy strains on healthcare resources.  As this pandemic evolves, our facility, providers, and staff strive to respond fluidly, to  remain operational, and to provide care relative to available resources and information. Outcomes are unpredictable and treatments are without well-defined guidelines. Further, the impact of COVID-19 on all aspects of urgent care, including the impact to patients seeking care for reasons other than COVID-19, is unavoidable during this national emergency. At this time of the global pandemic, management of patients has significantly changed, even for non-COVID positive patients given high local and regional COVID volumes at this time requiring high healthcare system and resource utilization. The standard of care for management of both COVID suspected and non-COVID suspected patients continues to change rapidly at the local, regional, national, and global levels. This patient was worked up and treated to the best available but ever changing evidence and resources available at this current time.   Documentation was completed with the aid of voice recognition software. Transcription may contain typographical errors.  Final Clinical Impressions(s) / UC Diagnoses   Final diagnoses:  Viral illness     Discharge Instructions     Take medications as prescribed. You may take tylenol or ibuprofen as needed for fevers/headache/body aches. Drink plenty of fluids. Stay in home isolation until you receive results  of your COVID test. You will only be notified for positive results. You may go online to MyChart in the next few days and review your results. Please follow CDC guidelines that are attached. You may discontinue home isolation when there has been at least 10 days since symptoms onset AND 3 days fever free without antipyretics (Tylenol or Ibuprofen) AND an overall improvement in your symptoms. Go to the ED immediately if you get worse or have any other symptoms.   Feel better soon!  Aldona Bar, FNP-C      ED Prescriptions    Medication Sig Dispense Auth. Provider   promethazine-dextromethorphan (PROMETHAZINE-DM) 6.25-15 MG/5ML syrup Take 5 mLs by mouth 4 (four) times daily as needed for cough. 120 mL Enrique Sack, FNP     PDMP not reviewed this encounter.   Enrique Sack, [place] 08/24/19 1224

## 2019-08-26 LAB — NOVEL CORONAVIRUS, NAA (HOSP ORDER, SEND-OUT TO REF LAB; TAT 18-24 HRS): SARS-CoV-2, NAA: DETECTED — AB

## 2019-08-27 ENCOUNTER — Telehealth: Payer: Self-pay | Admitting: Emergency Medicine

## 2019-08-27 NOTE — Telephone Encounter (Signed)

## 2019-09-11 ENCOUNTER — Encounter (HOSPITAL_COMMUNITY): Payer: Self-pay | Admitting: Emergency Medicine

## 2019-09-11 ENCOUNTER — Emergency Department (HOSPITAL_COMMUNITY): Payer: BC Managed Care – PPO

## 2019-09-11 ENCOUNTER — Other Ambulatory Visit: Payer: Self-pay

## 2019-09-11 ENCOUNTER — Emergency Department (HOSPITAL_COMMUNITY)
Admission: EM | Admit: 2019-09-11 | Discharge: 2019-09-11 | Disposition: A | Discharge status: Home / Self Care | Payer: BC Managed Care – PPO | Attending: Emergency Medicine | Admitting: Emergency Medicine

## 2019-09-11 DIAGNOSIS — R0789 Other chest pain: Secondary | ICD-10-CM | POA: Diagnosis not present

## 2019-09-11 LAB — COMPREHENSIVE METABOLIC PANEL
ALT: 52 U/L — ABNORMAL HIGH (ref 0–44)
AST: 28 U/L (ref 15–41)
Albumin: 4.3 g/dL (ref 3.5–5.0)
Alkaline Phosphatase: 65 U/L (ref 38–126)
Anion gap: 10 (ref 5–15)
BUN: 15 mg/dL (ref 6–20)
CO2: 25 mmol/L (ref 22–32)
Calcium: 9.3 mg/dL (ref 8.9–10.3)
Chloride: 103 mmol/L (ref 98–111)
Creatinine, Ser: 0.88 mg/dL (ref 0.61–1.24)
GFR calc Af Amer: >60 mL/min (ref >60)
GFR calc non Af Amer: >60 mL/min (ref >60)
Glucose, Bld: 94 mg/dL (ref 70–99)
Potassium: 4.5 mmol/L (ref 3.5–5.1)
Sodium: 138 mmol/L (ref 135–145)
Total Bilirubin: 0.6 mg/dL (ref 0.3–1.2)
Total Protein: 6.6 g/dL (ref 6.5–8.1)

## 2019-09-11 LAB — CBC WITH DIFFERENTIAL/PLATELET
Abs Immature Granulocytes: 0.12 10*3/uL — ABNORMAL HIGH (ref 0.00–0.07)
Basophils Absolute: 0.1 10*3/uL (ref 0.0–0.1)
Basophils Relative: 2 %
Eosinophils Absolute: 0.1 10*3/uL (ref 0.0–0.5)
Eosinophils Relative: 2 %
HCT: 44.4 % (ref 39.0–52.0)
Hemoglobin: 15 g/dL (ref 13.0–17.0)
Immature Granulocytes: 2 %
Lymphocytes Relative: 38 %
Lymphs Abs: 2.6 10*3/uL (ref 0.7–4.0)
MCH: 30.3 pg (ref 26.0–34.0)
MCHC: 33.8 g/dL (ref 30.0–36.0)
MCV: 89.7 fL (ref 80.0–100.0)
Monocytes Absolute: 0.5 10*3/uL (ref 0.1–1.0)
Monocytes Relative: 8 %
Neutro Abs: 3.4 10*3/uL (ref 1.7–7.7)
Neutrophils Relative %: 48 %
Platelets: 272 10*3/uL (ref 150–400)
RBC: 4.95 MIL/uL (ref 4.22–5.81)
RDW: 12.9 % (ref 11.5–15.5)
WBC: 6.8 10*3/uL (ref 4.0–10.5)
nRBC: 0 % (ref 0.0–0.2)

## 2019-09-11 LAB — D-DIMER, QUANTITATIVE: D-Dimer, Quant: 0.29 ug/mL-FEU (ref 0.00–0.50)

## 2019-09-11 LAB — TROPONIN I (HIGH SENSITIVITY)
Troponin I (High Sensitivity): 4 ng/L (ref <18)
Troponin I (High Sensitivity): 4 ng/L (ref <18)

## 2019-09-11 MED ORDER — KETOROLAC TROMETHAMINE 30 MG/ML IJ SOLN: 30.0000 mg | Freq: Once | INTRAMUSCULAR | Status: DC

## 2019-09-11 MED ORDER — SODIUM CHLORIDE 0.9% FLUSH: 3.0000 mL | Freq: Once | INTRAVENOUS | Status: DC

## 2019-09-11 MED ORDER — MORPHINE SULFATE (PF) 4 MG/ML IV SOLN: 4.0000 mg | Freq: Once | INTRAVENOUS | Status: DC

## 2019-09-11 NOTE — ED Provider Notes (Signed)
[facility]   CSN: [id number] Arrival date & time: 09/11/19  1049     History Chief Complaint  Patient presents with  . covid + 11/29  . Chest Pain    Jacob Germanyyler Tavenner is a 33 y.o. male with recent COVID-19 infection diagnosed on 08/24/2019 presenting to the ED with a chief complaint of chest pain.  For the past several days been having sharp, right-sided chest pain that is worse with deep inspiration.  No specific alleviating factor.  Symptoms got worse today.  He has had intermittent shortness of breath as well as a dry cough since being diagnosed with COVID-19.  He has been taking a cough syrup as well as Excedrin with improvement in his headache, body aches and cough.  He denies history of similar chest pain in the past.  Denies any history of DVT, PE, MI, recent immobilization, leg pain or swelling, fever, nausea, vomiting or injuries.  He denies any tobacco or drug use.  HPI     History reviewed. No pertinent past medical history.  Patient Active Problem List   Diagnosis Date Noted  . Acute appendicitis 07/18/2018    Past Surgical History:  Procedure Laterality Date  . APPENDECTOMY    . LAPAROSCOPIC APPENDECTOMY N/A 07/18/2018   Procedure: APPENDECTOMY LAPAROSCOPIC;  Surgeon: Abigail MiyamotoBlackman, Douglas, MD;  [facility];  Service: General;  Laterality: N/A;  . TONSILLECTOMY         Family History  Problem Relation Age of Onset  . Hypertension Mother   . Bradycardia Father     Social History   Tobacco Use  . Smoking status: Never Smoker  . Smokeless tobacco: Never Used  Substance Use Topics  . Alcohol use: Not Currently  . Drug use: Never    Home Medications Prior to Admission medications   Medication Sig Start Date End Date Taking? Authorizing Provider  ibuprofen (ADVIL,MOTRIN) 800 MG tablet Take 1 tablet (800 mg total) by mouth 3 (three) times daily. Patient not taking: Reported on 09/11/2019 11/15/18   Elson AreasSofia,  Leslie K, PA-C  omeprazole (PRILOSEC) 20 MG capsule Take 1 capsule (20 mg total) by mouth daily for 14 days. Patient not taking: Reported on 09/11/2019 04/29/19 05/13/19  Wieters, Hallie C, PA-C  promethazine-dextromethorphan (PROMETHAZINE-DM) 6.25-15 MG/5ML syrup Take 5 mLs by mouth 4 (four) times daily as needed for cough. Patient not taking: Reported on 09/11/2019 08/24/19   Lurline IdolMurrill, Samantha, FNP  Simethicone 80 MG TABS Take 1 tablet (80 mg total) by mouth 4 (four) times daily as needed. Patient not taking: Reported on 09/11/2019 04/29/19   Patterson HammersmithWieters, Hallie C, PA-C    Allergies    Patient has no known allergies.  Review of Systems   Review of Systems  Constitutional: Negative for appetite change, chills and fever.  HENT: Negative for ear pain, rhinorrhea, sneezing and sore throat.   Eyes: Negative for photophobia and visual disturbance.  Respiratory: Negative for cough, chest tightness, shortness of breath and wheezing.   Cardiovascular: Positive for chest pain. Negative for palpitations.  Gastrointestinal: Negative for abdominal pain, blood in stool, constipation, diarrhea, nausea and vomiting.  Genitourinary: Negative for dysuria, hematuria and urgency.  Musculoskeletal: Negative for myalgias.  Skin: Negative for rash.  Neurological: Negative for dizziness, weakness and light-headedness.    Physical Exam Updated Vital Signs BP 115/66   Pulse 79   Temp 98.1 F (36.7 C)   Resp (!) 21   Ht 6\' 5"  (1.956 m)  Wt (!) 167.8 kg   SpO2 95%   BMI 43.88 kg/m   Physical Exam Vitals and nursing note reviewed.  Constitutional:      General: He is not in acute distress.    Appearance: He is well-developed. He is obese.     Comments: No acute distress.  Speaking in complete sentences without difficulty.  HENT:     Head: Normocephalic and atraumatic.     Nose: Nose normal.  Eyes:     General: No scleral icterus.       Left eye: No discharge.     Conjunctiva/sclera: Conjunctivae  normal.  Cardiovascular:     Rate and Rhythm: Normal rate and regular rhythm.     Heart sounds: Normal heart sounds. No murmur. No friction rub. No gallop.   Pulmonary:     Effort: Pulmonary effort is normal. No respiratory distress.     Breath sounds: Normal breath sounds.  Abdominal:     General: Bowel sounds are normal. There is no distension.     Palpations: Abdomen is soft.     Tenderness: There is no abdominal tenderness. There is no guarding.  Musculoskeletal:        General: Normal range of motion.     Cervical back: Normal range of motion and neck supple.     Comments: No lower extremity edema, erythema or calf tenderness bilaterally.  Skin:    General: Skin is warm and dry.     Findings: No rash.  Neurological:     Mental Status: He is alert.     Motor: No abnormal muscle tone.     Coordination: Coordination normal.     ED Results / Procedures / Treatments   Labs (all labs ordered are listed, but only abnormal results are displayed) Labs Reviewed  COMPREHENSIVE METABOLIC PANEL - Abnormal; Notable for the following components:      Result Value   ALT 52 (*)    All other components within normal limits  CBC WITH DIFFERENTIAL/PLATELET - Abnormal; Notable for the following components:   Abs Immature Granulocytes 0.12 (*)    All other components within normal limits  D-DIMER, QUANTITATIVE (NOT AT [facility])  TROPONIN I (HIGH SENSITIVITY)  TROPONIN I (HIGH SENSITIVITY)    EKG EKG Interpretation  Date/Time:  Thursday September 11 2019 10:56:03 EST Ventricular Rate:  93 PR Interval:  156 QRS Duration: 86 QT Interval:  344 QTC Calculation: 427 R Axis:   87 Text Interpretation: Sinus rhythm with occasional Premature ventricular complexes Otherwise normal ECG When compared to prior, similar PVC> No STEMI Confirmed by Antony Blackbird [phone]) on 09/11/2019 1:48:28 PM   Radiology DG Chest Portable 1 View  Result Date: 09/11/2019 CLINICAL DATA:  Chest pain, COVID,  shortness of breath EXAM: PORTABLE CHEST 1 VIEW COMPARISON:  11/15/2018 FINDINGS: Heart and mediastinal contours are within normal limits. No focal opacities or effusions. No acute bony abnormality. IMPRESSION: No active disease. Electronically Signed   By: Rolm Baptise M.D.   On: 09/11/2019 13:07    Procedures Procedures (including critical care time)  Medications Ordered in ED Medications  sodium chloride flush (NS) 0.9 % injection 3 mL (has no administration in time range)  ketorolac (TORADOL) 30 MG/ML injection 30 mg (0 mg Intramuscular Hold 09/11/19 1618)    ED Course  I have reviewed the triage vital signs and the nursing notes.  Pertinent labs & imaging results that were available during my care of the patient were reviewed by me and considered  in my medical decision making (see chart for details).    MDM Rules/Calculators/A&P                      Jacob Carr was evaluated in Emergency Department on 09/11/19  for the symptoms described in the history of present illness. He/she was evaluated in the context of the global COVID-19 pandemic, which necessitated consideration that the patient might be at risk for infection with the SARS-CoV-2 virus that causes COVID-19. Institutional protocols and algorithms that pertain to the evaluation of patients at risk for COVID-19 are in a state of rapid change based on information released by regulatory bodies including the CDC and federal and state organizations. These policies and algorithms were followed during the patient's care in the ED.  33 year old male with a positive COVID-19 test 2 weeks ago presenting to the ED with a chief complaint of chest pain.  States that overall his shortness of breath and dry cough have improved since his symptom onset.  He reports right-sided chest pain worse with deep inspiration.  On exam he is overall well-appearing.  No lower extremity edema, erythema or calf tenderness are concerning for DVT.  He speaking in  complete sentences, no signs of respiratory distress.  Work-up including D-dimer, initial delta troponin, CBC and BMP all unremarkable.  EKG shows normal sinus rhythm, no changes from prior tracings.  Chest x-ray is unremarkable.  His vital signs have remained within normal limits throughout his entire ED visit.  He declined pain medications.  Based on his work-up and risk factors, I have low suspicion for PE, ACS or other emergent cause of his symptoms.  Suspect that symptoms could be musculoskeletal in nature patient on his recent viral infection and cough.  We will have him treat with Tylenol and ibuprofen and follow-up with PCP.  Patient is hemodynamically stable, in NAD, and able to ambulate in the ED. Evaluation does not show pathology that would require ongoing emergent intervention or inpatient treatment. I explained the diagnosis to the patient. Pain has been managed and has no complaints prior to discharge. Patient is comfortable with above plan and is stable for discharge at this time. All questions were answered prior to disposition. Strict return precautions for returning to the ED were discussed. Encouraged follow up with PCP.   An After Visit Summary was printed and given to the patient.   Portions of this note were generated with Scientist, clinical (histocompatibility and immunogenetics). Dictation errors may occur despite best attempts at proofreading.  Final Clinical Impression(s) / ED Diagnoses Final diagnoses:  Chest wall pain    Rx / DC Orders ED Discharge Orders    None       Dietrich Pates, PA-C 09/11/19 1715    Tegeler, Canary Brim, MD 09/12/19 [phone]

## 2019-09-11 NOTE — ED Triage Notes (Addendum)
C/o chest pain right sided continuously for 2 hours radiates to right back area-pain started 1 week ago, but was intermittent- shortness of breath still since covid

## 2019-09-11 NOTE — Discharge Instructions (Signed)
You can continue taking Tylenol and ibuprofen as needed for your symptoms. It is important for you to follow-up with your primary care provider. Return to the ED if you start to experience worsening symptoms, increased chest pain or shortness of breath, vomiting or coughing up blood or leg swelling.

## 2019-09-11 NOTE — ED Notes (Signed)
Pt denies any changes or need for pain meds.  Updated on POC and will likely dc after repeat troponin resulted.

## 2020-02-11 DIAGNOSIS — K7581 Nonalcoholic steatohepatitis (NASH): Secondary | ICD-10-CM | POA: Insufficient documentation

## 2020-09-25 ENCOUNTER — Other Ambulatory Visit: Payer: Self-pay

## 2020-09-25 ENCOUNTER — Other Ambulatory Visit (HOSPITAL_COMMUNITY): Payer: BC Managed Care – PPO

## 2020-09-25 ENCOUNTER — Emergency Department (HOSPITAL_COMMUNITY): Payer: BC Managed Care – PPO

## 2020-09-25 ENCOUNTER — Encounter (HOSPITAL_COMMUNITY): Payer: Self-pay | Admitting: Emergency Medicine

## 2020-09-25 ENCOUNTER — Inpatient Hospital Stay (HOSPITAL_COMMUNITY)
Admission: EM | Admit: 2020-09-25 | Discharge: 2020-09-26 | DRG: 193 | Disposition: A | Discharge status: Home / Self Care | Payer: BC Managed Care – PPO | Attending: Student in an Organized Health Care Education/Training Program | Admitting: Student in an Organized Health Care Education/Training Program

## 2020-09-25 DIAGNOSIS — R599 Enlarged lymph nodes, unspecified: Secondary | ICD-10-CM | POA: Diagnosis present

## 2020-09-25 DIAGNOSIS — I10 Essential (primary) hypertension: Secondary | ICD-10-CM | POA: Diagnosis present

## 2020-09-25 DIAGNOSIS — J9601 Acute respiratory failure with hypoxia: Secondary | ICD-10-CM | POA: Diagnosis present

## 2020-09-25 DIAGNOSIS — A419 Sepsis, unspecified organism: Secondary | ICD-10-CM | POA: Diagnosis present

## 2020-09-25 DIAGNOSIS — J189 Pneumonia, unspecified organism: Secondary | ICD-10-CM | POA: Diagnosis present

## 2020-09-25 DIAGNOSIS — Z6841 Body Mass Index (BMI) 40.0 and over, adult: Secondary | ICD-10-CM

## 2020-09-25 DIAGNOSIS — Z8616 Personal history of COVID-19: Secondary | ICD-10-CM | POA: Diagnosis not present

## 2020-09-25 DIAGNOSIS — J069 Acute upper respiratory infection, unspecified: Secondary | ICD-10-CM

## 2020-09-25 DIAGNOSIS — J302 Other seasonal allergic rhinitis: Secondary | ICD-10-CM | POA: Diagnosis present

## 2020-09-25 DIAGNOSIS — Z20822 Contact with and (suspected) exposure to covid-19: Secondary | ICD-10-CM | POA: Diagnosis present

## 2020-09-25 DIAGNOSIS — J123 Human metapneumovirus pneumonia: Secondary | ICD-10-CM | POA: Diagnosis present

## 2020-09-25 DIAGNOSIS — R6520 Severe sepsis without septic shock: Secondary | ICD-10-CM | POA: Diagnosis present

## 2020-09-25 DIAGNOSIS — Z8249 Family history of ischemic heart disease and other diseases of the circulatory system: Secondary | ICD-10-CM

## 2020-09-25 HISTORY — DX: Essential (primary) hypertension: I10

## 2020-09-25 HISTORY — DX: Migraine, unspecified, not intractable, without status migrainosus: G43.909

## 2020-09-25 HISTORY — DX: Exercise induced bronchospasm: J45.990

## 2020-09-25 HISTORY — DX: Other seasonal allergic rhinitis: J30.2

## 2020-09-25 LAB — CBC WITH DIFFERENTIAL/PLATELET
Abs Immature Granulocytes: 0.11 10*3/uL — ABNORMAL HIGH (ref 0.00–0.07)
Basophils Absolute: 0.1 10*3/uL (ref 0.0–0.1)
Basophils Relative: 1 %
Eosinophils Absolute: 0.2 10*3/uL (ref 0.0–0.5)
Eosinophils Relative: 2 %
HCT: 48.1 % (ref 39.0–52.0)
Hemoglobin: 16.5 g/dL (ref 13.0–17.0)
Immature Granulocytes: 1 %
Lymphocytes Relative: 16 %
Lymphs Abs: 1.6 10*3/uL (ref 0.7–4.0)
MCH: 31.5 pg (ref 26.0–34.0)
MCHC: 34.3 g/dL (ref 30.0–36.0)
MCV: 91.8 fL (ref 80.0–100.0)
Monocytes Absolute: 1 10*3/uL (ref 0.1–1.0)
Monocytes Relative: 11 %
Neutro Abs: 6.7 10*3/uL (ref 1.7–7.7)
Neutrophils Relative %: 69 %
Platelets: 258 10*3/uL (ref 150–400)
RBC: 5.24 MIL/uL (ref 4.22–5.81)
RDW: 11.9 % (ref 11.5–15.5)
WBC: 9.7 10*3/uL (ref 4.0–10.5)
nRBC: 0 % (ref 0.0–0.2)

## 2020-09-25 LAB — URINALYSIS, ROUTINE W REFLEX MICROSCOPIC
Bilirubin Urine: NEGATIVE
Glucose, UA: NEGATIVE mg/dL
Hgb urine dipstick: NEGATIVE
Ketones, ur: NEGATIVE mg/dL
Leukocytes,Ua: NEGATIVE
Nitrite: NEGATIVE
Protein, ur: NEGATIVE mg/dL
Specific Gravity, Urine: 1.042 — ABNORMAL HIGH (ref 1.005–1.030)
pH: 5 (ref 5.0–8.0)

## 2020-09-25 LAB — LACTIC ACID, PLASMA
Lactic Acid, Venous: 2.6 mmol/L (ref 0.5–1.9)
Lactic Acid, Venous: 2.3 mmol/L (ref 0.5–1.9)
Lactic Acid, Venous: 2.2 mmol/L (ref 0.5–1.9)

## 2020-09-25 LAB — HIV ANTIBODY (ROUTINE TESTING W REFLEX): HIV Screen 4th Generation wRfx: NONREACTIVE

## 2020-09-25 LAB — COMPREHENSIVE METABOLIC PANEL
ALT: 32 U/L (ref 0–44)
AST: 25 U/L (ref 15–41)
Albumin: 4.4 g/dL (ref 3.5–5.0)
Alkaline Phosphatase: 73 U/L (ref 38–126)
Anion gap: 12 (ref 5–15)
BUN: 8 mg/dL (ref 6–20)
CO2: 20 mmol/L — ABNORMAL LOW (ref 22–32)
Calcium: 9.2 mg/dL (ref 8.9–10.3)
Chloride: 106 mmol/L (ref 98–111)
Creatinine, Ser: 0.96 mg/dL (ref 0.61–1.24)
GFR, Estimated: >60 mL/min (ref >60)
Glucose, Bld: 107 mg/dL — ABNORMAL HIGH (ref 70–99)
Potassium: 4.1 mmol/L (ref 3.5–5.1)
Sodium: 138 mmol/L (ref 135–145)
Total Bilirubin: 0.8 mg/dL (ref 0.3–1.2)
Total Protein: 7.1 g/dL (ref 6.5–8.1)

## 2020-09-25 LAB — RESP PANEL BY RT-PCR (FLU A&B, COVID) ARPGX2
Influenza A by PCR: NEGATIVE
Influenza B by PCR: NEGATIVE
SARS Coronavirus 2 by RT PCR: NEGATIVE

## 2020-09-25 LAB — PROCALCITONIN: Procalcitonin: <0.1 ng/mL

## 2020-09-25 LAB — APTT: aPTT: 36 seconds (ref 24–36)

## 2020-09-25 LAB — PROTIME-INR
INR: 1 (ref 0.8–1.2)
Prothrombin Time: 13.1 seconds (ref 11.4–15.2)

## 2020-09-25 LAB — TROPONIN I (HIGH SENSITIVITY)
Troponin I (High Sensitivity): 3 ng/L (ref <18)
Troponin I (High Sensitivity): 3 ng/L (ref <18)

## 2020-09-25 MED ORDER — GUAIFENESIN 100 MG/5ML PO SOLN
5.0000 mL | ORAL | Status: DC | PRN
  Administered 2020-09-25: 100 mg via ORAL
  Filled 2020-09-25 (×3): qty 5

## 2020-09-25 MED ORDER — SODIUM CHLORIDE 0.9 % IV SOLN
1.0000 g | Freq: Once | INTRAVENOUS | Status: AC
  Administered 2020-09-25: 1 g via INTRAVENOUS
  Filled 2020-09-25: qty 10

## 2020-09-25 MED ORDER — IOHEXOL 350 MG/ML SOLN
90.0000 mL | Freq: Once | INTRAVENOUS | Status: AC | PRN
  Administered 2020-09-25: 90 mL via INTRAVENOUS

## 2020-09-25 MED ORDER — ONDANSETRON HCL 4 MG/2ML IJ SOLN: 4.0000 mg | Freq: Four times a day (QID) | INTRAMUSCULAR | Status: DC | PRN

## 2020-09-25 MED ORDER — LACTATED RINGERS IV SOLN: INTRAVENOUS | Status: AC

## 2020-09-25 MED ORDER — ENOXAPARIN SODIUM 80 MG/0.8ML ~~LOC~~ SOLN
80.0000 mg | SUBCUTANEOUS | Status: DC
  Administered 2020-09-25: 80 mg via SUBCUTANEOUS
  Filled 2020-09-25 (×2): qty 0.8

## 2020-09-25 MED ORDER — EMTRICITABINE-TENOFOVIR AF 200-25 MG PO TABS
1.0000 | ORAL_TABLET | Freq: Every day | ORAL | Status: DC
  Administered 2020-09-26: 1 via ORAL
  Filled 2020-09-25: qty 1

## 2020-09-25 MED ORDER — LACTATED RINGERS IV SOLN
INTRAVENOUS | Status: DC
Start: 2020-09-25 — End: 2020-09-25

## 2020-09-25 MED ORDER — LACTATED RINGERS IV BOLUS (SEPSIS)
1000.0000 mL | Freq: Once | INTRAVENOUS | Status: AC
  Administered 2020-09-25: 1000 mL via INTRAVENOUS

## 2020-09-25 MED ORDER — ACETAMINOPHEN 650 MG RE SUPP: 650.0000 mg | Freq: Four times a day (QID) | RECTAL | Status: DC | PRN

## 2020-09-25 MED ORDER — SODIUM CHLORIDE 0.9 % IV SOLN: 1.0000 g | Freq: Every day | INTRAVENOUS | Status: DC

## 2020-09-25 MED ORDER — ALBUTEROL SULFATE HFA 108 (90 BASE) MCG/ACT IN AERS
2.0000 | INHALATION_SPRAY | Freq: Once | RESPIRATORY_TRACT | Status: AC
  Administered 2020-09-25: 2 via RESPIRATORY_TRACT
  Filled 2020-09-25: qty 6.7

## 2020-09-25 MED ORDER — ACETAMINOPHEN 325 MG PO TABS
650.0000 mg | ORAL_TABLET | Freq: Once | ORAL | Status: AC | PRN
  Administered 2020-09-25: 650 mg via ORAL
  Filled 2020-09-25: qty 2

## 2020-09-25 MED ORDER — AZITHROMYCIN 500 MG IV SOLR
500.0000 mg | Freq: Once | INTRAVENOUS | Status: AC
  Administered 2020-09-25: 500 mg via INTRAVENOUS
  Filled 2020-09-25: qty 500

## 2020-09-25 MED ORDER — MENTHOL 3 MG MT LOZG
1.0000 | LOZENGE | OROMUCOSAL | Status: DC | PRN
  Filled 2020-09-25: qty 9

## 2020-09-25 MED ORDER — ACETAMINOPHEN 325 MG PO TABS: 650.0000 mg | ORAL_TABLET | Freq: Four times a day (QID) | ORAL | Status: DC | PRN

## 2020-09-25 MED ORDER — SODIUM CHLORIDE 0.9 % IV SOLN: 500.0000 mg | Freq: Every day | INTRAVENOUS | Status: DC

## 2020-09-25 MED ORDER — ONDANSETRON HCL 4 MG PO TABS: 4.0000 mg | ORAL_TABLET | Freq: Four times a day (QID) | ORAL | Status: DC | PRN

## 2020-09-25 MED ORDER — LACTATED RINGERS IV BOLUS (SEPSIS)
800.0000 mL | Freq: Once | INTRAVENOUS | Status: AC
Start: 2020-09-25 — End: 2020-09-25
  Administered 2020-09-25: 800 mL via INTRAVENOUS

## 2020-09-25 MED ORDER — AZITHROMYCIN 250 MG PO TABS: 500.0000 mg | ORAL_TABLET | Freq: Every day | ORAL | Status: DC

## 2020-09-25 NOTE — Plan of Care (Signed)
  Problem: Clinical Measurements: Goal: Cardiovascular complication will be avoided Outcome: Progressing   Problem: Health Behavior/Discharge Planning: Goal: Ability to manage health-related needs will improve Outcome: Progressing   Problem: Education: Goal: Knowledge of General Education information will improve Description: Including pain rating scale, medication(s)/side effects and non-pharmacologic comfort measures Outcome: Progressing   Problem: Nutrition: Goal: Adequate nutrition will be maintained Outcome: Progressing

## 2020-09-25 NOTE — H&P (Signed)
Date: 09/25/2020               Patient Name:  Jacob Carr MRN: [id number]  DOB: 05/18/1986 Age / Sex: 35 y.o., male   PCP: Azzie Roup, FNP at [facility]         Medical Service: Internal Medicine Teaching Service         Attending Physician: Dr. Erlinda Hong    First Contact: Dr. Imogene Burn Pager: [phone]  Second Contact: Dr. Karilyn Cota Pager: [phone]       After Hours (After 5p/  First Contact Pager: [phone]  weekends / holidays): Second Contact Pager: [phone]   Chief Complaint: Cough, shortness of breath  History of Present Illness:   Jacob Carr is a 35 y.o. male with hx of morbid obesity, hypertension, migraines presenting for cough productive of yellow sputum, shortness of breath, fatigue, pleuritic chest pain, and fever of 100.2 with onset 5 days ago.  He is unable to characterize his chest pain, but it has resolved now.  He is vaccinated against COVID.  Noted that he was around several family members who had similar symptoms recently, his father was started on outpatient antibiotics.  They all tested negative for Covid.  He identifies as a MSM and takes Descovy for preexposure prophylaxis.  He has 2 partners who are both negative for HIV, and states they do not have other partners.  Last tested negative for HIV in July.  No recent unexplained weight loss or night sweats.  Recently had long car rides up to [place] then [place] and back.  No recent travel outside the country.  Does endorse intermittent right-sided abdominal pain rated 5 out of 10 for the past 2 months without other associated symptoms.  Not worse with food, nothing seems to exacerbate or alleviate this. Denies chills, nausea, vomiting, diarrhea, syncope.  States he is eating and drinking adequately.  ED Course: Desat to upper 80s at rest on room air. LA of 2.6 -> 2.3 before fluids administered. COVID negative. Started on Ceftriaxone and azithromycin.  CTA negative for PE but demonstrates scattered multilobar  opacities.   Allergies as of 09/25/2020  . (No Known Allergies)    Past Medical History:  Diagnosis Date  . Exercise-induced asthma    As a child, now resolved  . Hypertension   . Migraine   . Seasonal allergies     Past Surgical History:  Procedure Laterality Date  . APPENDECTOMY    . LAPAROSCOPIC APPENDECTOMY N/A 07/18/2018   Procedure: APPENDECTOMY LAPAROSCOPIC;  Surgeon: Abigail Miyamoto, MD;  Location: [facility] OR;  Service: General;  Laterality: N/A;  . TONSILLECTOMY      Family History  Problem Relation Age of Onset  . Hypertension Mother   . Bradycardia Father     Social History   Tobacco Use  . Smoking status: Never Smoker  . Smokeless tobacco: Never Used  Vaping Use  . Vaping Use: Never used  Substance Use Topics  . Alcohol use: Yes    Alcohol/week: 8.0 standard drinks    Types: 8 Cans of beer per week  . Drug use: Never   MSM. Has 2 male sexual partners and states they do not have other partners.  Takes HIV prophylaxis but no history of HIV.  Review of Systems: Review of Systems  Constitutional: Positive for fever. Negative for chills, diaphoresis and weight loss.  HENT: Positive for congestion and sore throat.   Eyes: Negative for blurred vision and double vision.  Respiratory:  Positive for cough, sputum production and shortness of breath. Negative for wheezing.   Cardiovascular: Positive for chest pain. Negative for claudication and leg swelling.  Gastrointestinal: Positive for blood in stool (small streak). Negative for abdominal pain, constipation, diarrhea, nausea and vomiting.  Genitourinary: Negative for dysuria and frequency.  Musculoskeletal: Negative for falls and myalgias.  Skin: Negative for itching and rash.  Neurological: Positive for dizziness (lightheaded). Negative for loss of consciousness.     Physical Exam: Blood pressure 127/74, pulse 90, temperature 98.7 F (37.1 C), temperature source Oral, resp. rate (!) 24, height 6\' 5"   (1.956 m), weight (!) 158.8 kg, SpO2 93 %. Physical Exam Vitals and nursing note reviewed.  Constitutional:      General: He is not in acute distress.    Appearance: He is obese. He is not ill-appearing.  HENT:     Head: Normocephalic and atraumatic.  Eyes:     Extraocular Movements: Extraocular movements intact.  Neck:     Vascular: No JVD.  Cardiovascular:     Rate and Rhythm: Regular rhythm. Tachycardia present.     Heart sounds: Normal heart sounds. No murmur heard. No friction rub. No gallop.   Pulmonary:     Breath sounds: Rales (Mild scattered rales) present.  Abdominal:     General: Bowel sounds are normal.     Palpations: Abdomen is soft.     Tenderness: There is no rebound.  Musculoskeletal:        General: Normal range of motion.     Cervical back: Normal range of motion and neck supple.     Right lower leg: No tenderness. No edema.     Left lower leg: No tenderness. No edema.  Skin:    General: Skin is warm and dry.  Neurological:     General: No focal deficit present.     Mental Status: He is alert and oriented to person, place, and time.  Psychiatric:        Mood and Affect: Mood normal.        Behavior: Behavior normal.      EKG: personally reviewed my interpretation is tachycardic, sinus rhythm, no ST changes  CXR: personally reviewed my interpretation is no obvious opacities.  Per radiology read has scattered subtle opacities, but I am able to appreciate them.  CT Angio Chest PE W/Cm &/Or Wo Cm IMPRESSION: 1. No pulmonary embolus. 2. Mild hazy ground-glass opacity is identified in the posterior right upper lobe and in the left lower lobe. This is nonspecific but can be seen in early pneumonia. 3. Mildly enlarged lymph nodes in bilateral hilum and mediastinum probably reactive lymph nodes.   Assessment & Plan by Problem: Active Problems:   Sepsis with acute hypoxic respiratory failure (HCC)   Jacob Carr is a 35 y.o. male with hx of morbid obesity,  hypertension, migraines presenting for community-acquired pneumonia.  Has multilobar pneumonia.  No clear predisposing factors at this point.  Sepsis secondary to community-acquired multi lobar pneumonia with acute hypoxic respiratory failure Presented with fever of 100.9, respirations 24, heart rate greater than 100 but is hemodynamically stable.  Saturation of 85% on ambulation without oxygen.  Lactic acidosis of 2.6 initially then 2.3 before fluids were administered.  Currently saturating in mid 90s on 3 L nasal cannula.  Breathing comfortably.  Does have scattered rales.  Chest imaging with scattered peripheral opacities in multiple lobes.  COVID-19 negative and has been vaccinated.  Likely due to a virus or atypical pathogen.  No clear risk factors except for being a MSM, but is on HIV preexposure prophylaxis.  Did have recent long car ride, but CTA negative for pulmonary embolism. -Started on ceftriaxone and azithromycin in ED -IV fluids -Follow-up repeat lactic acid -Follow-up HIV screen -Follow-up procalcitonin -Follow-up respiratory viral panel -Oxygen supplementation as needed via nasal cannula -Start Robitussin as needed  Dispo: Admit patient to Inpatient with expected length of stay greater than 2 midnights.  Signed: Andrew Au, MD 09/25/2020, 5:09 PM  Pager: [phone]  After 5pm on weekdays and 1pm on weekends: On Call pager: [phone]

## 2020-09-25 NOTE — ED Provider Notes (Signed)
[facility] EMERGENCY DEPARTMENT Provider Note   CSN: [id number] Arrival date & time: 09/25/20  0630     History Chief Complaint  Patient presents with  . Chest Pain  . Shortness of Breath    Jacob Carr is a 35 y.o. male who presents with concern for cough, chest pain for the last 3 days, now with significant shortness of breath and lightheadedness since yesterday.  Patient endorses Covid vaccination x3.   States that he has spent the last approximately 8 days driving around the [place] and [place] visiting friends and family.  He denies known sick contacts, denies fevers but endorses chills at home, denies nausea, vomiting, diarrhea, loss of taste or smell, congestion.  He does endorse mild headache.  He denies palpitations.  He denies history of malignancy, blood clot, denies hormone replacement therapy, denies recent surgery.  I personally reviewed this patient's medical records.  History of appendectomy, tonsillectomy.  Patient is on HIV prep, as preventative measure from contracting HIV, however he does NOT have a diagnosis of HIV as is shown in his chart.  HPI     Past Medical History:  Diagnosis Date  . HIV (human immunodeficiency virus infection) [facility])     Patient Active Problem List   Diagnosis Date Noted  . Acute appendicitis 07/18/2018    Past Surgical History:  Procedure Laterality Date  . APPENDECTOMY    . LAPAROSCOPIC APPENDECTOMY N/A 07/18/2018   Procedure: APPENDECTOMY LAPAROSCOPIC;  Surgeon: Abigail Miyamoto, MD;  Location: [facility] OR;  Service: General;  Laterality: N/A;  . TONSILLECTOMY         Family History  Problem Relation Age of Onset  . Hypertension Mother   . Bradycardia Father     Social History   Tobacco Use  . Smoking status: Never Smoker  . Smokeless tobacco: Never Used  Vaping Use  . Vaping Use: Never used  Substance Use Topics  . Alcohol use: Yes  . Drug use: Never    Home Medications Prior to Admission  medications   Medication Sig Start Date End Date Taking? Authorizing Provider  ibuprofen (ADVIL,MOTRIN) 800 MG tablet Take 1 tablet (800 mg total) by mouth 3 (three) times daily. Patient not taking: Reported on 09/11/2019 11/15/18   Elson Areas, PA-C  omeprazole (PRILOSEC) 20 MG capsule Take 1 capsule (20 mg total) by mouth daily for 14 days. Patient not taking: Reported on 09/11/2019 04/29/19 05/13/19  Wieters, Hallie C, PA-C  promethazine-dextromethorphan (PROMETHAZINE-DM) 6.25-15 MG/5ML syrup Take 5 mLs by mouth 4 (four) times daily as needed for cough. Patient not taking: Reported on 09/11/2019 08/24/19   Lurline Idol, FNP  Simethicone 80 MG TABS Take 1 tablet (80 mg total) by mouth 4 (four) times daily as needed. Patient not taking: Reported on 09/11/2019 04/29/19   Patterson Hammersmith C, PA-C    Allergies    Patient has no known allergies.  Review of Systems   Review of Systems  Constitutional: Positive for activity change, appetite change, chills and fatigue. Negative for fever.  HENT: Positive for congestion. Negative for sore throat and trouble swallowing.   Eyes: Negative.   Respiratory: Positive for cough, chest tightness, shortness of breath and wheezing.   Cardiovascular: Positive for chest pain. Negative for palpitations and leg swelling.  Gastrointestinal: Negative for abdominal pain, nausea and vomiting.  Genitourinary: Negative for dysuria and hematuria.  Musculoskeletal: Negative.   Skin: Negative.   Allergic/Immunologic: Negative.   Neurological: Positive for light-headedness and headaches. Negative for  dizziness, seizures, syncope, facial asymmetry and weakness.  Psychiatric/Behavioral: Negative.     Physical Exam Updated Vital Signs BP 127/74 (BP Location: Right Arm)   Pulse 90   Temp 98.7 F (37.1 C) (Oral)   Resp (!) 24   Ht 6\' 5"  (1.956 m)   Wt (!) 158.8 kg   SpO2 93%   BMI 41.50 kg/m   Physical Exam Vitals and nursing note reviewed.   Constitutional:      Appearance: He is obese.  HENT:     Head: Normocephalic and atraumatic.     Nose: Nose normal.     Mouth/Throat:     Mouth: Mucous membranes are moist.     Pharynx: Oropharynx is clear. Uvula midline. No oropharyngeal exudate, posterior oropharyngeal erythema or uvula swelling.     Tonsils: No tonsillar exudate.  Eyes:     General:        Right eye: No discharge.        Left eye: No discharge.     Extraocular Movements: Extraocular movements intact.     Conjunctiva/sclera: Conjunctivae normal.     Pupils: Pupils are equal, round, and reactive to light.  Neck:     Trachea: Trachea and phonation normal.  Cardiovascular:     Rate and Rhythm: Normal rate and regular rhythm.     Pulses: Normal pulses.          Radial pulses are 2+ on the right side and 2+ on the left side.       Dorsalis pedis pulses are 2+ on the right side and 2+ on the left side.     Heart sounds: Normal heart sounds. No murmur heard.   Pulmonary:     Effort: Tachypnea present. No prolonged expiration or respiratory distress.     Breath sounds: Examination of the right-upper field reveals wheezing. Examination of the right-middle field reveals wheezing and rales. Examination of the left-middle field reveals wheezing. Examination of the right-lower field reveals rales. Examination of the left-lower field reveals decreased breath sounds and rales. Decreased breath sounds, wheezing and rales present.  Chest:     Chest wall: No deformity, swelling, tenderness, crepitus or edema.  Abdominal:     General: Bowel sounds are normal. There is no distension.     Palpations: Abdomen is soft.     Tenderness: There is no abdominal tenderness. There is no guarding or rebound.  Musculoskeletal:        General: No deformity. Normal range of motion.     Cervical back: Neck supple. No rigidity or crepitus. No pain with movement, spinous process tenderness or muscular tenderness.     Right lower leg: No  tenderness. No edema.     Left lower leg: No tenderness. No edema.  Lymphadenopathy:     Cervical: No cervical adenopathy.  Skin:    General: Skin is warm and dry.     Capillary Refill: Capillary refill takes less than 2 seconds.  Neurological:     General: No focal deficit present.     Mental Status: He is alert and oriented to person, place, and time. Mental status is at baseline.     Sensory: Sensation is intact.     Motor: Motor function is intact.  Psychiatric:        Mood and Affect: Mood normal.     ED Results / Procedures / Treatments   Labs (all labs ordered are listed, but only abnormal results are displayed) Labs Reviewed  COMPREHENSIVE METABOLIC  PANEL - Abnormal; Notable for the following components:      Result Value   CO2 20 (*)    Glucose, Bld 107 (*)    All other components within normal limits  LACTIC ACID, PLASMA - Abnormal; Notable for the following components:   Lactic Acid, Venous 2.6 (*)    All other components within normal limits  LACTIC ACID, PLASMA - Abnormal; Notable for the following components:   Lactic Acid, Venous 2.3 (*)    All other components within normal limits  CBC WITH DIFFERENTIAL/PLATELET - Abnormal; Notable for the following components:   Abs Immature Granulocytes 0.11 (*)    All other components within normal limits  RESP PANEL BY RT-PCR (FLU A&B, COVID) ARPGX2  CULTURE, BLOOD (ROUTINE X 2)  CULTURE, BLOOD (ROUTINE X 2)  URINE CULTURE  PROTIME-INR  APTT  URINALYSIS, ROUTINE W REFLEX MICROSCOPIC  PROCALCITONIN  TROPONIN I (HIGH SENSITIVITY)  TROPONIN I (HIGH SENSITIVITY)    EKG EKG Interpretation  Date/Time:  Saturday September 25 2020 10:00:53 EST Ventricular Rate:  117 PR Interval:  154 QRS Duration: 84 QT Interval:  302 QTC Calculation: 421 R Axis:   87 Text Interpretation: Sinus tachycardia Otherwise normal ECG Confirmed by Tilden Fossa [id number]) on 09/25/2020 11:48:56 AM   Radiology CT Angio Chest PE W/Cm &/Or Wo  Cm  Result Date: 09/25/2020 CLINICAL DATA:  Shortness of breath and chest pain. EXAM: CT ANGIOGRAPHY CHEST WITH CONTRAST TECHNIQUE: Multidetector CT imaging of the chest was performed using the standard protocol during bolus administration of intravenous contrast. Multiplanar CT image reconstructions and MIPs were obtained to evaluate the vascular anatomy. CONTRAST:  86mL OMNIPAQUE IOHEXOL 350 MG/ML SOLN COMPARISON:  Chest x-ray September 25, 2020 FINDINGS: Cardiovascular: Satisfactory opacification of the pulmonary arteries to the segmental level. No evidence of pulmonary embolism. Normal heart size. No pericardial effusion. Mediastinum/Nodes: There are mild enlarged lymph nodes in bilateral hilum and mediastinum probably reactive lymph nodes. The airways are patent. Trachea is normal. Lungs/Pleura: Mild hazy ground-glass opacity is identified in the posterior right upper lobe. Mild hazy opacities identified in the left lower lobe. There are dependent atelectasis of the posterior bilateral lung bases. There is no pleural effusion. Upper Abdomen: No acute abnormality. Musculoskeletal: Minimal degenerative joint changes of the spine are noted. Review of the MIP images confirms the above findings. IMPRESSION: 1. No pulmonary embolus. 2. Mild hazy ground-glass opacity is identified in the posterior right upper lobe and in the left lower lobe. This is nonspecific but can be seen in early pneumonia. 3. Mildly enlarged lymph nodes in bilateral hilum and mediastinum probably reactive lymph nodes. Electronically Signed   By: Sherian Rein M.D.   On: 09/25/2020 15:07   DG Chest Portable 1 View  Result Date: 09/25/2020 CLINICAL DATA:  35 year old male with history of shortness of breath and cough. History of COVID infection. EXAM: PORTABLE CHEST 1 VIEW COMPARISON:  Chest x-ray 09/11/2019. FINDINGS: Ill-defined opacities in the periphery of the lungs bilaterally, most evident in the lung bases. No pleural effusions. No  pneumothorax. No evidence of pulmonary edema. Heart size is normal. Upper mediastinal contours are within normal limits. IMPRESSION: 1. Ill-defined opacities throughout the periphery of the lower lungs bilaterally, compatible with multilobar pneumonia, likely from reported COVID infection. Electronically Signed   By: Trudie Reed M.D.   On: 09/25/2020 11:20    Procedures Procedures (including critical care time)  Medications Ordered in ED Medications  lactated ringers infusion ( Intravenous New Bag/Given 09/25/20 1323)  lactated ringers infusion (has no administration in time range)  lactated ringers bolus 1,000 mL (1,000 mLs Intravenous New Bag/Given 09/25/20 1558)    And  lactated ringers bolus 1,000 mL (has no administration in time range)    And  lactated ringers bolus 800 mL (has no administration in time range)  acetaminophen (TYLENOL) tablet 650 mg (650 mg Oral Given 09/25/20 1021)  albuterol (VENTOLIN HFA) 108 (90 Base) MCG/ACT inhaler 2 puff (2 puffs Inhalation Given 09/25/20 1323)  cefTRIAXone (ROCEPHIN) 1 g in sodium chloride 0.9 % 100 mL IVPB (1 g Intravenous New Bag/Given 09/25/20 1556)  azithromycin (ZITHROMAX) 500 mg in sodium chloride 0.9 % 250 mL IVPB (500 mg Intravenous New Bag/Given 09/25/20 1516)  iohexol (OMNIPAQUE) 350 MG/ML injection 90 mL (90 mLs Intravenous Contrast Given 09/25/20 1502)    ED Course  I have reviewed the triage vital signs and the nursing notes.  Pertinent labs & imaging results that were available during my care of the patient were reviewed by me and considered in my medical decision making (see chart for details).    MDM Rules/Calculators/A&P                         35 year old male without significant past medical history presents with 4 days of cough, now with progressively worsening shortness of breath, central chest pain, lightheadedness.  Patient has received 3 doses of COVID-19 vaccination.  Differential diagnosis for this patient symptoms  include but are not limited to COVID-19, pneumonia, PE, ACS, dysrhythmia, pneumothorax, reactive airway disease, asthma.    Patient febrile on intake to 100.9, tachycardic to 119.  Vital signs otherwise normal.  At time of my initial exam, patient has received basic laboratory studies in triage.  Initial lactate was elevated significantly to 2.6, CBC unremarkable, CMP unremarkable.  Physical exam significant for tachycardia, increased work of breathing, coarse breath sounds throughout, with rales in the bases bilaterally, and wheezing in the mid lung fields bilaterally.  Chest x-ray in triage revealed Ill-defined opacities throughout the periphery of the lower lungs bilaterally, compatible with multilobar pneumonia.  Given tachycardia, elevated lactic acid and known source for infection with obvious pneumonia on chest x-ray, code sepsis was activated.  Will start antibiotics for CAP.  Respiratory pathogen panel negative for COVID-19, influenza A/B.  EKG with sinus tach, no STEMI.   Repeat lactic acid pending.   Given recent prolonged travel by car, shortness of breath, and tachycardia, will proceed with CT angiogram to rule out PE.  CT PE study negative for PE, however showed early signs of pneumonia, as well as reactive mediastinal and hilar lymph nodes.  At time of my reevaluation the patient, he is saturating 88% on room air.  He was placed on 3 L of supplemental oxygen by nasal cannula.  Ambulatory O2 dropped to 85% on room air, significant DOE and increased work of breathing.  At this time I feel patient would benefit from admission to the hospital for management of hypoxia secondary to evolving pneumonia.  Consult placed to family medicine teaching service resident Dr. Laural Golden; she is agreeable to seeing the patient in the emergency department and admitting him to her service.  Appreciate her collaboration in the care of this patient.  Griffin voiced understanding of his medical  evaluation and treatment plan.  Each of his questions were answered to his expressed satisfaction.  He is amenable to plan for admission to the hospital at this time.  Patient is  stable for transition to inpatient setting.  Pink Maye was evaluated in Emergency Department on 09/25/2020 for the symptoms described in the history of present illness. He was evaluated in the context of the global COVID-19 pandemic, which necessitated consideration that the patient might be at risk for infection with the SARS-CoV-2 virus that causes COVID-19. Institutional protocols and algorithms that pertain to the evaluation of patients at risk for COVID-19 are in a state of rapid change based on information released by regulatory bodies including the CDC and federal and state organizations. These policies and algorithms were followed during the patient's care in the ED.  This chart was dictated using voice recognition software, Dragon. Despite the best efforts of this provider to proofread and correct errors, errors may still occur which can change documentation meaning.   Final Clinical Impression(s) / ED Diagnoses Final diagnoses:  None    Rx / DC Orders ED Discharge Orders    None       Sherrilee Gilles 09/25/20 1627    Tilden Fossa, MD 09/25/20 1642

## 2020-09-25 NOTE — ED Triage Notes (Signed)
Pt reports chest pain, SOB, cough, and feeling lightheaded since yesterday.  Has not took Tylenol.

## 2020-09-25 NOTE — ED Notes (Signed)
Patient transported to CT 

## 2020-09-25 NOTE — ED Notes (Signed)
Pt ambulated in room, 02 sat=85% on ambulation. Pt short of breath while walking. Pt placed back into bed and 02 applied, )2 sat =93% on 3LNC. PA notified.

## 2020-09-26 DIAGNOSIS — J189 Pneumonia, unspecified organism: Secondary | ICD-10-CM | POA: Diagnosis not present

## 2020-09-26 LAB — BASIC METABOLIC PANEL
Anion gap: 12 (ref 5–15)
BUN: 10 mg/dL (ref 6–20)
CO2: 22 mmol/L (ref 22–32)
Calcium: 8.9 mg/dL (ref 8.9–10.3)
Chloride: 104 mmol/L (ref 98–111)
Creatinine, Ser: 0.87 mg/dL (ref 0.61–1.24)
GFR, Estimated: >60 mL/min (ref >60)
Glucose, Bld: 86 mg/dL (ref 70–99)
Potassium: 4 mmol/L (ref 3.5–5.1)
Sodium: 138 mmol/L (ref 135–145)

## 2020-09-26 LAB — RESPIRATORY PANEL BY PCR

## 2020-09-26 LAB — CBC
HCT: 40.9 % (ref 39.0–52.0)
Hemoglobin: 14 g/dL (ref 13.0–17.0)
MCH: 31 pg (ref 26.0–34.0)
MCHC: 34.2 g/dL (ref 30.0–36.0)
MCV: 90.7 fL (ref 80.0–100.0)
Platelets: 187 10*3/uL (ref 150–400)
RBC: 4.51 MIL/uL (ref 4.22–5.81)
RDW: 11.8 % (ref 11.5–15.5)
WBC: 6.5 10*3/uL (ref 4.0–10.5)
nRBC: 0 % (ref 0.0–0.2)

## 2020-09-26 LAB — LACTIC ACID, PLASMA
Lactic Acid, Venous: 1.7 mmol/L (ref 0.5–1.9)
Lactic Acid, Venous: 1.6 mmol/L (ref 0.5–1.9)

## 2020-09-26 LAB — URINE CULTURE: Culture: NO GROWTH

## 2020-09-26 MED ORDER — MENTHOL 3 MG MT LOZG: 1.0000 | LOZENGE | OROMUCOSAL | 1 refills | Status: AC | PRN

## 2020-09-26 MED ORDER — GUAIFENESIN 100 MG/5ML PO SOLN: 5.0000 mL | ORAL | 0 refills | Status: AC | PRN

## 2020-09-26 NOTE — Progress Notes (Addendum)
   Subjective: Patient reports feeling better this morning. He states his breathing feels improved. He stats he walked to the bathroom last night and did okay. Eating well. Slept with supplemental oxygen overnight. Lives alone at home but his best friend lives next door.   Objective:  Vital signs in last 24 hours: Vitals:   09/25/20 1805 09/25/20 2033 09/26/20 0348 09/26/20 0747  BP: 115/65 132/83 126/64 124/66  Pulse: 85 81 70 76  Resp: (!) 21 16 18  (!) 21  Temp: 98.1 F (36.7 C) 99.4 F (37.4 C) 99.3 F (37.4 C) 99.5 F (37.5 C)  TempSrc: Oral Oral Oral Oral  SpO2: 100% 99% 100% 98%  Weight:   (!) 164.9 kg   Height:        Physical Exam Constitutional: no acute distress Head: atraumatic ENT: external ears normal Eyes: EOMI Cardiovascular: regular rate and rhythm, normal heart sounds Pulmonary: effort normal, lungs clear to ascultation bilaterally Abdominal: flat, nontender, no rebound tenderness, bowel sounds normal Skin: warm and dry Neurological: alert, no focal deficit Psychiatric: normal mood and affect  Assessment/Plan: Jacob Carr is a 35 y.o. male with hx of HTN, morbid obesity, and migrainges presenting in sepsis 2/2 multilobular pneumonia. COVID negative.  Respiratory panel with metapneumovirus.  Clinically improving.  Active Problems:   Sepsis with acute hypoxic respiratory failure (HCC)   CAP (community acquired pneumonia)   Community-acquired pneumonia with acute hypoxic respiratory failure secondary to metapneumovirus infection Afebrile since day of admission and vitals signs improving.  Questionably meets sepsis criteria with lactic acid 2.6 initially.  Did meet 3 SIRS criteria.  Chest imaging with scattered peripheral opacities in multiple lobes. Respiratory panel showed metapneumovirus.  Vital signs improved this morning. COVID-19 negative. No clear predisposing risk factors for severe infection. HIV negative.  -procalcitonin <0.10, stopping Abx -IV  fluids -Oxygen supplementation as needed via nasal cannula -Continue Robitussin as needed -Check ambulatory pulse ox today   Diet:  regular IVF:  Lactated ringers VTE:  lovenox Prior to Admission Living Arrangement:  home Anticipated Discharge Location:  home Barriers to Discharge:  Medical management Dispo: Anticipated discharge in approximately 0-1 day(s).   20, MD 09/26/2020, 8:00 AM Pager: [phone] After 5pm on weekdays and 1pm on weekends: On Call pager [phone]

## 2020-09-26 NOTE — Progress Notes (Signed)
Patient ambulatory O2 sats 95-97% on room air.

## 2020-09-26 NOTE — Discharge Summary (Signed)
Name: Jacob Carr MRN: [id number] DOB: May 24, 1986 35 y.o. PCP: Azzie Roup  Date of Admission: 09/25/2020  9:59 AM Date of Discharge:  09/26/2020 Attending Physician: Tyson Alias, *   Discharge Diagnosis: 1. Community-acquired pneumonia due to metapneumovirus  Discharge Medications: Allergies as of 09/26/2020   No Known Allergies     Medication List    STOP taking these medications   acetaminophen 500 MG tablet Commonly known as: TYLENOL     TAKE these medications   emtricitabine-tenofovir AF 200-25 MG tablet Commonly known as: DESCOVY Take 1 tablet by mouth daily.   guaiFENesin 100 MG/5ML Soln Commonly known as: ROBITUSSIN Take 5 mLs (100 mg total) by mouth every 4 (four) hours as needed for cough or to loosen phlegm.   Loratadine 10 MG Caps Chew 5 mg by mouth daily.   menthol-cetylpyridinium 3 MG lozenge Commonly known as: CEPACOL Take 1 lozenge (3 mg total) by mouth as needed for sore throat.   multivitamin tablet Take 1 tablet by mouth daily.       Disposition and follow-up:   [name] was discharged from [facility] in Good condition.  At the hospital follow up visit please address:  1.  Follow-up . Community-acquired pneumonia-HIV test negative and CBC with differential was normal, so no clear immunocompromising factors on work-up.  May work-up further if recurrent infections  2.  Labs / imaging needed at time of follow-up: None  3.  Pending labs/ test needing follow-up: Blood culture  Follow-up Appointments:   Hospital Course by problem list:  Community-acquired pneumonia with acute hypoxic respiratory failure secondary to metapneumovirus infection Questionable sepsis diagnosis with lactic acid of 2.6 initially. Saturation of 85% initially on room air, which quickly improved with nasal canula. Presented with fever of 100.9, respirations 24, heart rate greater than 100 but is hemodynamically stable.  Lactic acid  improved to 1.6 with fluids.  CTA with no pulmonary embolism but scattered peripheral subtle consolidations.  Started on azithromycin and ceftriaxone.  Procalcitonin was negative and respiratory panel was positive for metapneumovirus.  COVID-19 negative.  Antibiotics were stopped.  On day of discharge patient had improved vital signs and saturated 95 to 97% while ambulating on room air.  Breathing very comfortably.  Had negative HIV test and CBC with differential was normal.  Discharge Vitals:   BP 123/64 (BP Location: Right Arm)   Pulse 72   Temp 98.2 F (36.8 C) (Oral)   Resp 20   Ht 6\' 5"  (1.956 m)   Wt (!) 164.9 kg   SpO2 97%   BMI 43.11 kg/m   Pertinent Labs, Studies, and Procedures:  CT Angio Chest PE W/Cm &/Or Wo Cm  Result Date: 09/25/2020 CLINICAL DATA:  Shortness of breath and chest pain. EXAM: CT ANGIOGRAPHY CHEST WITH CONTRAST TECHNIQUE: Multidetector CT imaging of the chest was performed using the standard protocol during bolus administration of intravenous contrast. Multiplanar CT image reconstructions and MIPs were obtained to evaluate the vascular anatomy. CONTRAST:  76mL OMNIPAQUE IOHEXOL 350 MG/ML SOLN COMPARISON:  Chest x-ray September 25, 2020 FINDINGS: Cardiovascular: Satisfactory opacification of the pulmonary arteries to the segmental level. No evidence of pulmonary embolism. Normal heart size. No pericardial effusion. Mediastinum/Nodes: There are mild enlarged lymph nodes in bilateral hilum and mediastinum probably reactive lymph nodes. The airways are patent. Trachea is normal. Lungs/Pleura: Mild hazy ground-glass opacity is identified in the posterior right upper lobe. Mild hazy opacities identified in the left lower lobe. There are dependent  atelectasis of the posterior bilateral lung bases. There is no pleural effusion. Upper Abdomen: No acute abnormality. Musculoskeletal: Minimal degenerative joint changes of the spine are noted. Review of the MIP images confirms the above  findings. IMPRESSION: 1. No pulmonary embolus. 2. Mild hazy ground-glass opacity is identified in the posterior right upper lobe and in the left lower lobe. This is nonspecific but can be seen in early pneumonia. 3. Mildly enlarged lymph nodes in bilateral hilum and mediastinum probably reactive lymph nodes. Electronically Signed   By: Sherian Rein M.D.   On: 09/25/2020 15:07   DG Chest Portable 1 View  Result Date: 09/25/2020 CLINICAL DATA:  35 year old male with history of shortness of breath and cough. History of COVID infection. EXAM: PORTABLE CHEST 1 VIEW COMPARISON:  Chest x-ray 09/11/2019. FINDINGS: Ill-defined opacities in the periphery of the lungs bilaterally, most evident in the lung bases. No pleural effusions. No pneumothorax. No evidence of pulmonary edema. Heart size is normal. Upper mediastinal contours are within normal limits. IMPRESSION: 1. Ill-defined opacities throughout the periphery of the lower lungs bilaterally, compatible with multilobar pneumonia, likely from reported COVID infection. Electronically Signed   By: Trudie Reed M.D.   On: 09/25/2020 11:20   HIV Ab: Non Reactive (01/01 1740)   CBC    Component Value Date/Time   WBC 6.5 09/26/2020 0136   RBC 4.51 09/26/2020 0136   HGB 14.0 09/26/2020 0136   HCT 40.9 09/26/2020 0136   PLT 187 09/26/2020 0136   MCV 90.7 09/26/2020 0136   MCH 31.0 09/26/2020 0136   MCHC 34.2 09/26/2020 0136   RDW 11.8 09/26/2020 0136   LYMPHSABS 1.6 09/25/2020 1031   MONOABS 1.0 09/25/2020 1031   EOSABS 0.2 09/25/2020 1031   BASOSABS 0.1 09/25/2020 1031    Recent Labs    09/25/20 1455 09/25/20 1744 09/26/20 0136 09/26/20 0303  LATICACIDVEN 2.3* 2.2* 1.7 1.6     Discharge Instructions: Discharge Instructions    Call MD for:  difficulty breathing, headache or visual disturbances   Complete by: As directed    Call MD for:  persistant nausea and vomiting   Complete by: As directed    Diet general   Complete by: As  directed    Discharge instructions   Complete by: As directed    [name], it has been a pleasure taking care of you.  You were hospitalized for a viral pneumonia, and your condition improved greatly with IV fluids and supplemental oxygen.  At this time, I do not feel you need these interventions anymore.  You do not require antibiotics, but I have written prescriptions for Robitussin and lozenges which may help you feel a little better.  Do your best to stay hydrated at home.   Increase activity slowly   Complete by: As directed       Signed: Remo Lipps, MD 09/26/2020, 12:46 PM   Pager: [phone]

## 2020-09-30 LAB — CULTURE, BLOOD (ROUTINE X 2)
Culture: NO GROWTH
Culture: NO GROWTH
Special Requests: ADEQUATE
Special Requests: ADEQUATE

## 2022-08-03 IMAGING — DX DG CHEST 1V PORT
1 series · 1 of 1 positions shown · non-contrast
Comparison: Chest x-ray 09/11/2019.

CLINICAL DATA: 34-year-old male with history of shortness of breath
and cough. History of COVID infection.

EXAM:
PORTABLE CHEST 1 VIEW

[chest ap]
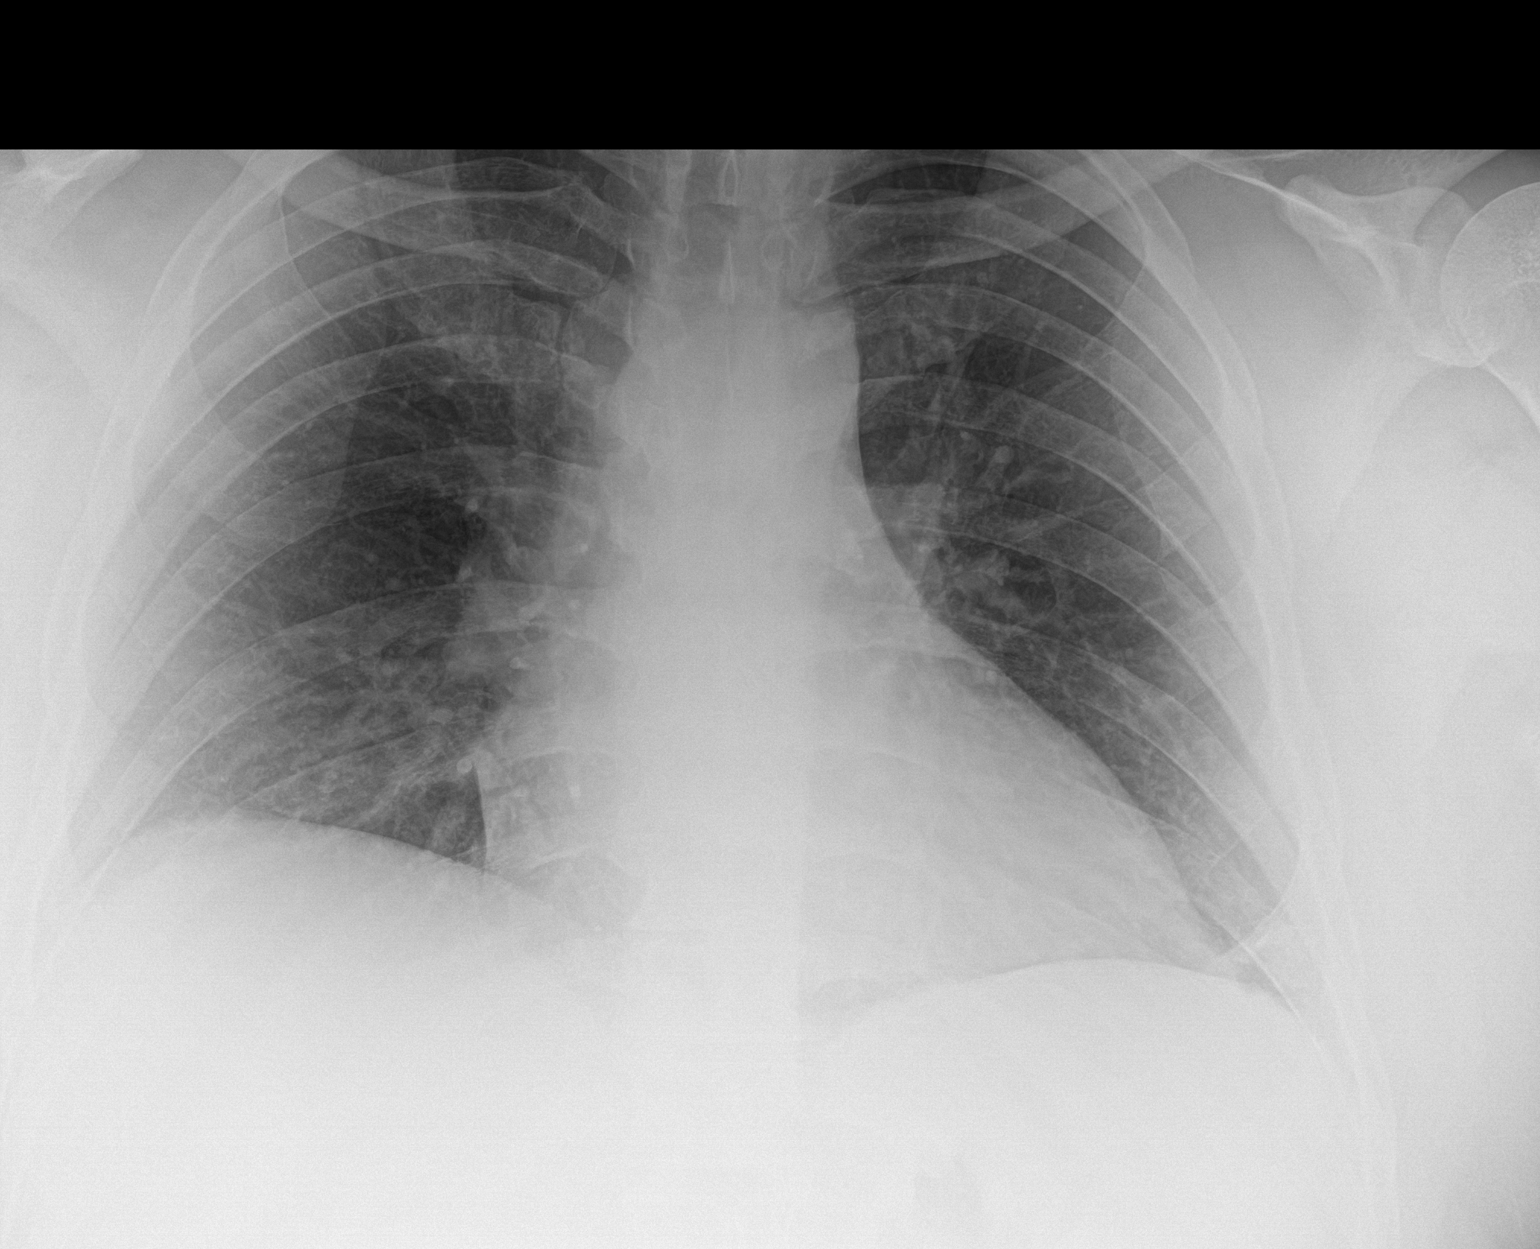

[1 of 1 positions shown; findings below may reference images not displayed]

FINDINGS: Ill-defined opacities in the periphery of the lungs bilaterally,
most evident in the lung bases. No pleural effusions. No
pneumothorax. No evidence of pulmonary edema. Heart size is normal.
Upper mediastinal contours are within normal limits.
IMPRESSION: 1. Ill-defined opacities throughout the periphery of the lower lungs
bilaterally, compatible with multilobar pneumonia, likely from
reported COVID infection.

## 2022-08-03 IMAGING — CT CT ANGIO CHEST
2 of 6 series · 18 of 46 positions shown · IV contrast (APPLIED)
Comparison: Chest x-ray September 25, 2020

CLINICAL DATA: Shortness of breath and chest pain.

EXAM:
CT ANGIOGRAPHY CHEST WITH CONTRAST
TECHNIQUE: Multidetector CT imaging of the chest was performed using the
standard protocol during bolus administration of intravenous
contrast. Multiplanar CT image reconstructions and MIPs were
obtained to evaluate the vascular anatomy.
CONTRAST:  90mL OMNIPAQUE IOHEXOL 350 MG/ML SOLN

[Series 6: thins · axial · 0.75mm/px · z∈[+1140,+1431]mm · 15 of 319 slices shown]
[im 14/319  lung]
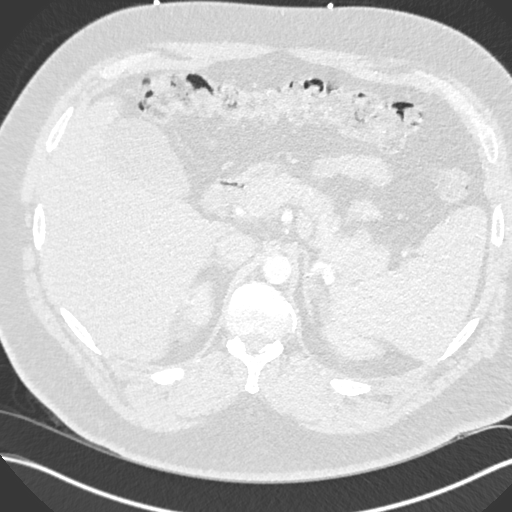
[im 42/319  soft-tissue]
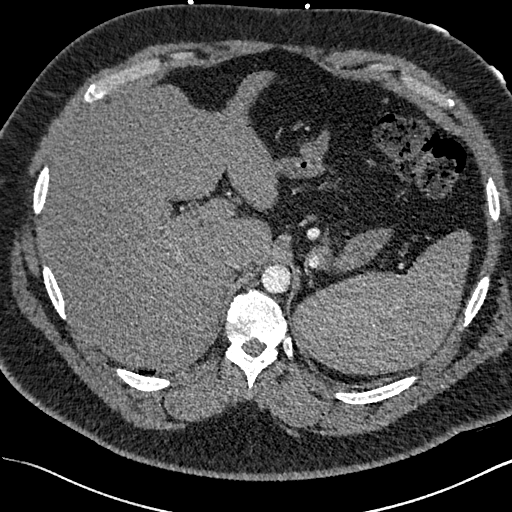
[im 56/319  lung]
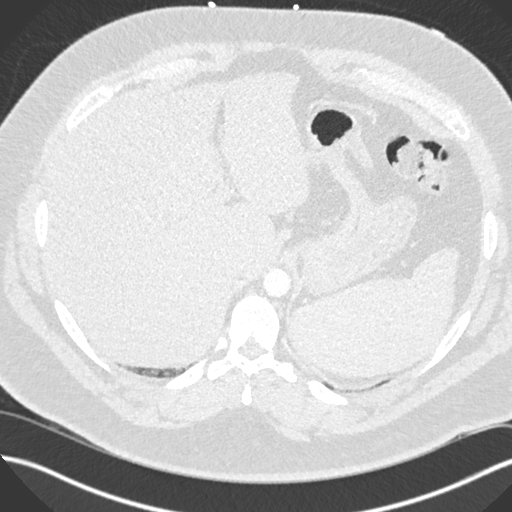
[im 83/319  soft-tissue]
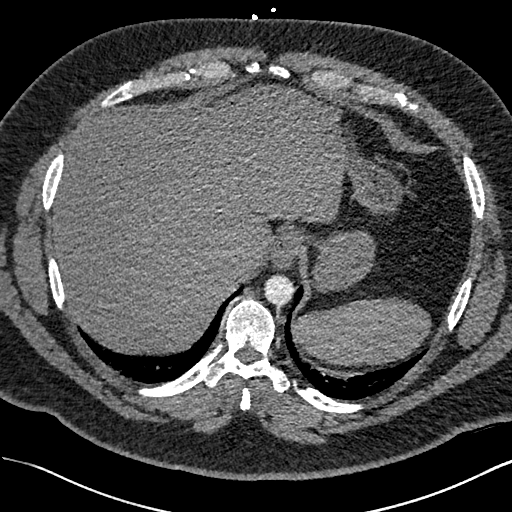
[im 97/319  lung]
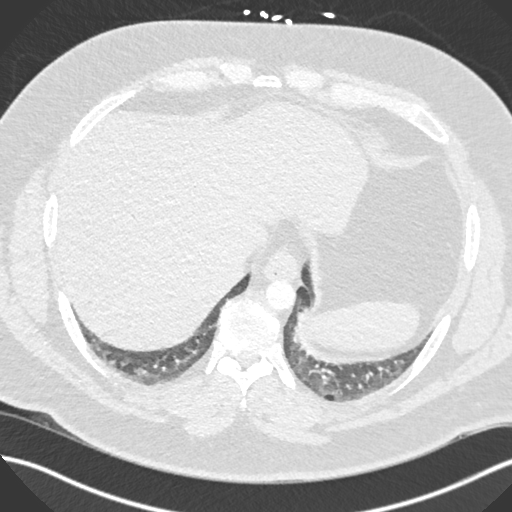
[im 125/319  soft-tissue]
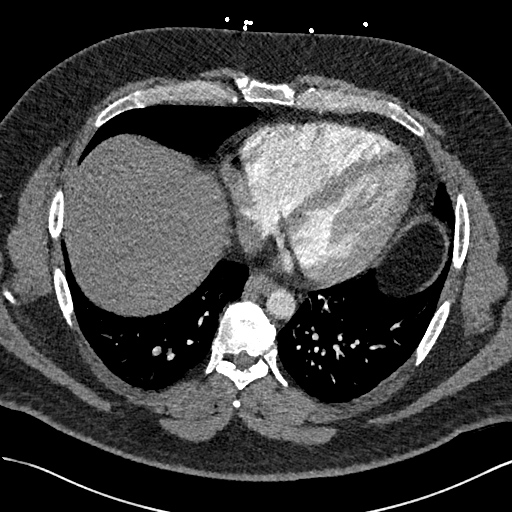
[im 139/319  lung]
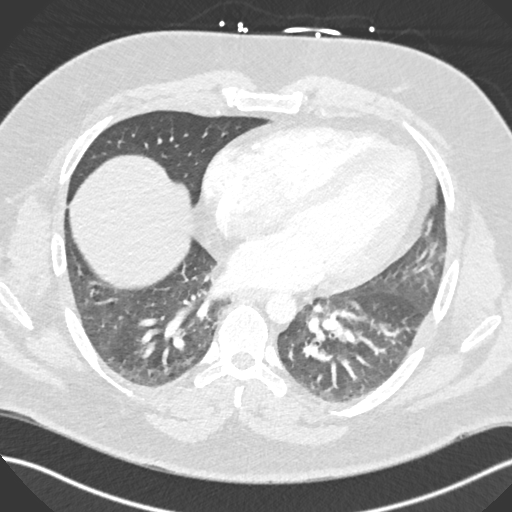
[im 166/319  soft-tissue]
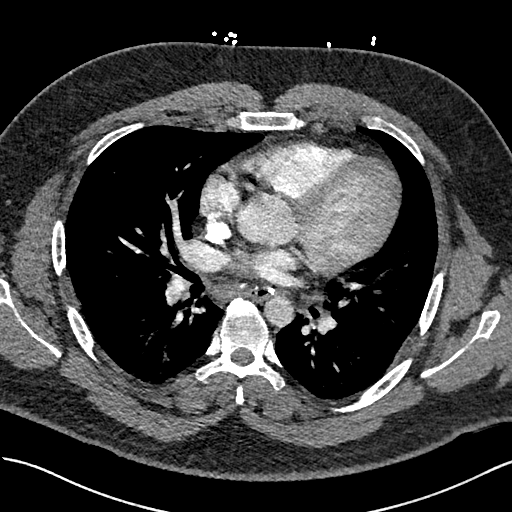
[im 180/319  lung]
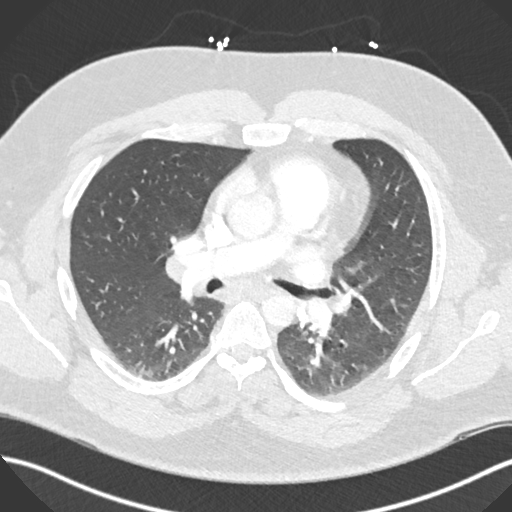
[im 194/319  soft-tissue]
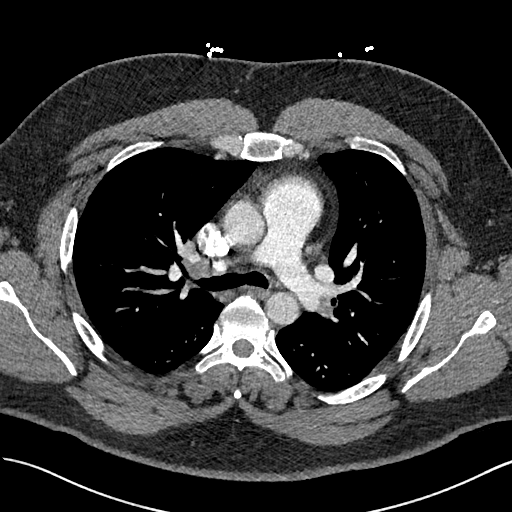
[im 222/319  lung]
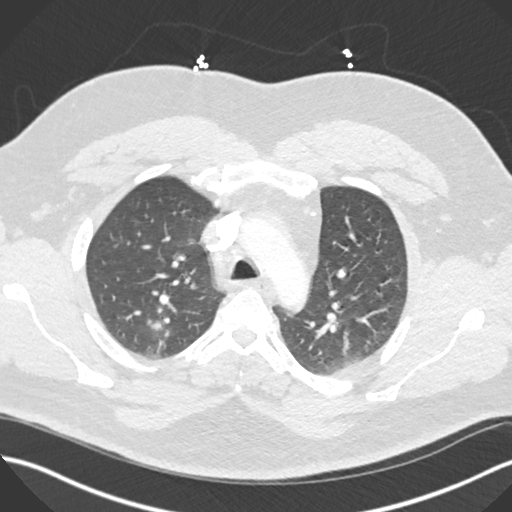
[im 236/319  soft-tissue]
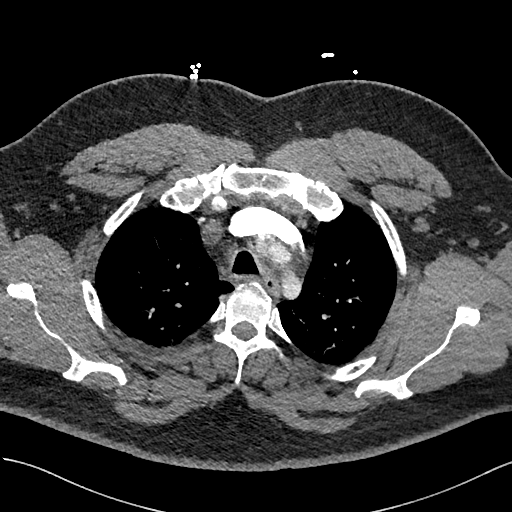
[im 263/319  lung]
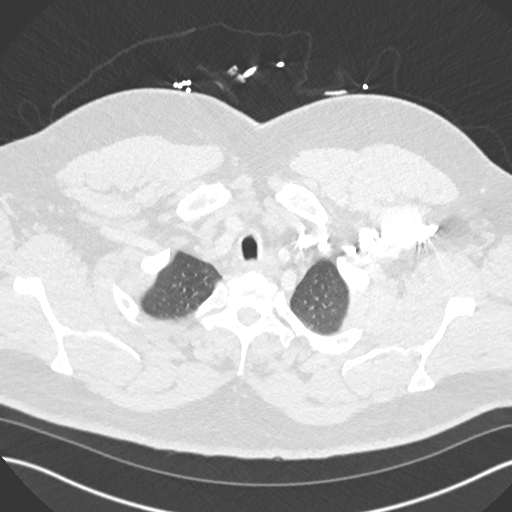
[im 277/319  soft-tissue]
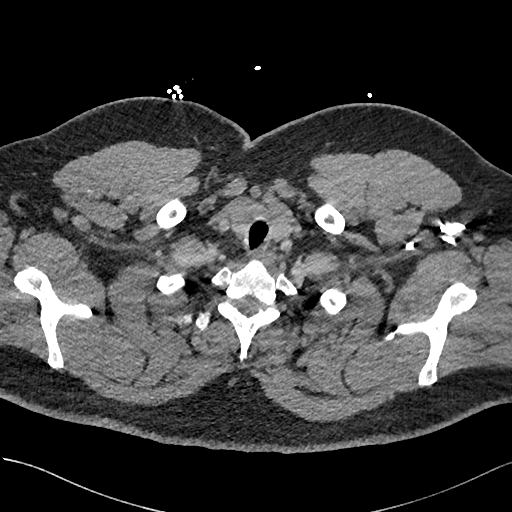
[im 305/319  lung]
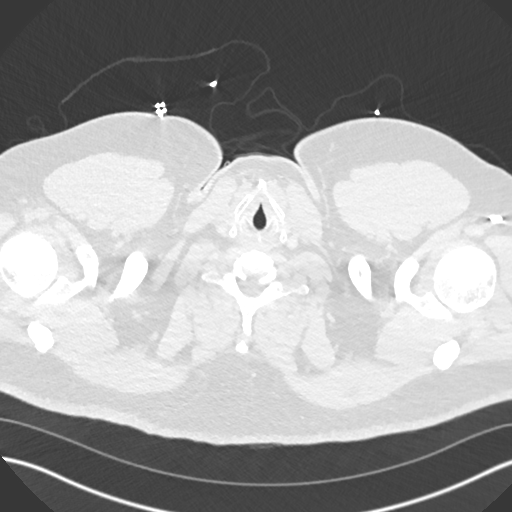

[Series 8: coronal mpr · coronal · 0.62mm/px · 3 of 167 slices shown]
[im 42/167  soft-tissue]
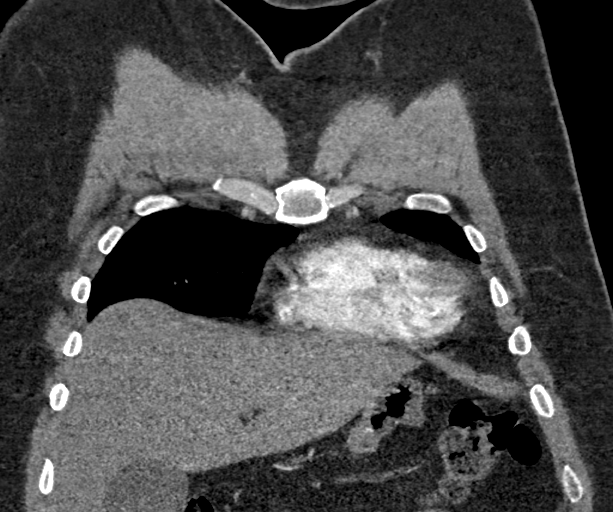
[im 84/167  soft-tissue]
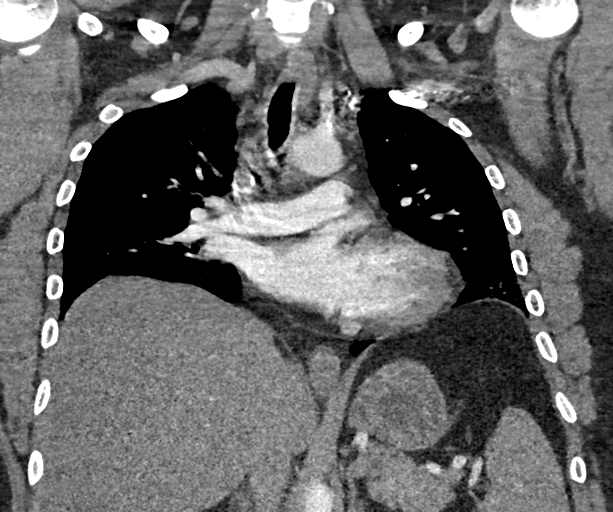
[im 125/167  soft-tissue]
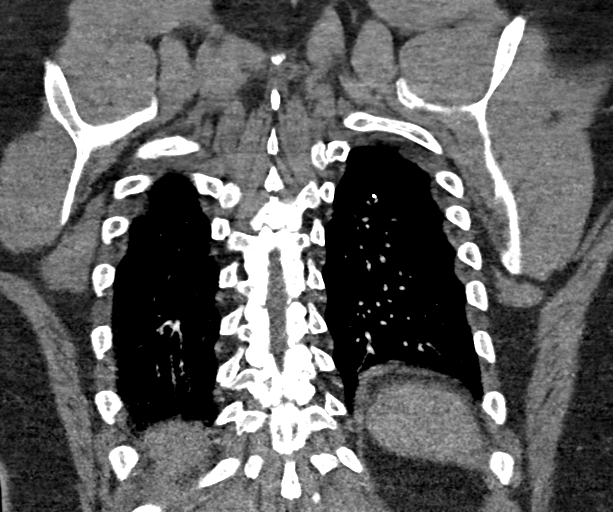

[18 of 46 positions shown; findings below may reference images not displayed]

FINDINGS: Cardiovascular: Satisfactory opacification of the pulmonary arteries
to the segmental level. No evidence of pulmonary embolism. Normal
heart size. No pericardial effusion.

Mediastinum/Nodes: There are mild enlarged lymph nodes in bilateral
hilum and mediastinum probably reactive lymph nodes. The airways are
patent. Trachea is normal.

Lungs/Pleura: Mild hazy ground-glass opacity is identified in the
posterior right upper lobe. Mild hazy opacities identified in the
left lower lobe. There are dependent atelectasis of the posterior
bilateral lung bases. There is no pleural effusion.

Upper Abdomen: No acute abnormality.

Musculoskeletal: Minimal degenerative joint changes of the spine are
noted.

Review of the MIP images confirms the above findings.
IMPRESSION: 1. No pulmonary embolus.
2. Mild hazy ground-glass opacity is identified in the posterior
right upper lobe and in the left lower lobe. This is nonspecific but
can be seen in early pneumonia.
3. Mildly enlarged lymph nodes in bilateral hilum and mediastinum
probably reactive lymph nodes.
